# Patient Record
Sex: Male | Born: 1980 | Hispanic: No | Marital: Single | Smoking: Never smoker
Health system: Southern US, Community
[De-identification: ages and names within clinical notes are randomized; demographics above are authoritative.]

## PROBLEM LIST (undated history)

## (undated) DIAGNOSIS — K589 Irritable bowel syndrome without diarrhea: Secondary | ICD-10-CM

## (undated) DIAGNOSIS — G629 Polyneuropathy, unspecified: Secondary | ICD-10-CM

## (undated) DIAGNOSIS — G7101 Duchenne or Becker muscular dystrophy: Secondary | ICD-10-CM

## (undated) HISTORY — DX: Polyneuropathy, unspecified: G62.9

## (undated) HISTORY — PX: OTHER SURGICAL HISTORY: SHX169

## (undated) HISTORY — DX: Irritable bowel syndrome, unspecified: K58.9

## (undated) HISTORY — DX: Duchenne or Becker muscular dystrophy: G71.01

---

## 2000-08-30 HISTORY — PX: OTHER SURGICAL HISTORY: SHX169

## 2003-07-01 HISTORY — PX: OTHER SURGICAL HISTORY: SHX169

## 2004-01-11 ENCOUNTER — Ambulatory Visit: Payer: Self-pay | Admitting: Internal Medicine

## 2004-02-17 ENCOUNTER — Ambulatory Visit: Payer: Self-pay | Admitting: Internal Medicine

## 2004-08-17 ENCOUNTER — Ambulatory Visit: Payer: Self-pay | Admitting: Internal Medicine

## 2005-01-17 ENCOUNTER — Ambulatory Visit: Payer: Self-pay | Admitting: Internal Medicine

## 2005-01-30 HISTORY — PX: OTHER SURGICAL HISTORY: SHX169

## 2005-02-12 ENCOUNTER — Ambulatory Visit: Payer: Self-pay | Admitting: Internal Medicine

## 2005-02-25 ENCOUNTER — Inpatient Hospital Stay (HOSPITAL_COMMUNITY): Admission: EM | Admit: 2005-02-25 | Discharge: 2005-02-28 | Payer: Self-pay | Admitting: Emergency Medicine

## 2005-02-25 ENCOUNTER — Ambulatory Visit: Payer: Self-pay | Admitting: Internal Medicine

## 2005-02-28 ENCOUNTER — Encounter: Payer: Self-pay | Admitting: Internal Medicine

## 2005-03-05 ENCOUNTER — Ambulatory Visit: Payer: Self-pay | Admitting: Internal Medicine

## 2005-08-12 ENCOUNTER — Ambulatory Visit: Payer: Self-pay | Admitting: Internal Medicine

## 2005-08-14 ENCOUNTER — Ambulatory Visit: Payer: Self-pay | Admitting: Internal Medicine

## 2006-01-20 ENCOUNTER — Ambulatory Visit: Payer: Self-pay | Admitting: Internal Medicine

## 2006-07-31 ENCOUNTER — Encounter: Payer: Self-pay | Admitting: Internal Medicine

## 2006-08-27 ENCOUNTER — Telehealth (INDEPENDENT_AMBULATORY_CARE_PROVIDER_SITE_OTHER): Payer: Self-pay | Admitting: *Deleted

## 2006-10-02 ENCOUNTER — Encounter: Payer: Self-pay | Admitting: Internal Medicine

## 2006-10-16 ENCOUNTER — Telehealth: Payer: Self-pay | Admitting: Internal Medicine

## 2006-10-30 ENCOUNTER — Telehealth: Payer: Self-pay | Admitting: Internal Medicine

## 2006-11-07 ENCOUNTER — Encounter: Payer: Self-pay | Admitting: Internal Medicine

## 2006-11-24 ENCOUNTER — Encounter: Payer: Self-pay | Admitting: Internal Medicine

## 2006-12-03 ENCOUNTER — Telehealth (INDEPENDENT_AMBULATORY_CARE_PROVIDER_SITE_OTHER): Payer: Self-pay | Admitting: *Deleted

## 2006-12-05 ENCOUNTER — Telehealth: Payer: Self-pay | Admitting: Internal Medicine

## 2006-12-26 ENCOUNTER — Encounter: Payer: Self-pay | Admitting: Internal Medicine

## 2007-01-01 ENCOUNTER — Encounter: Payer: Self-pay | Admitting: Internal Medicine

## 2007-01-14 ENCOUNTER — Encounter: Payer: Self-pay | Admitting: Internal Medicine

## 2007-01-22 ENCOUNTER — Encounter (INDEPENDENT_AMBULATORY_CARE_PROVIDER_SITE_OTHER): Payer: Self-pay | Admitting: *Deleted

## 2007-01-23 ENCOUNTER — Ambulatory Visit: Payer: Self-pay | Admitting: Internal Medicine

## 2007-01-23 ENCOUNTER — Telehealth: Payer: Self-pay | Admitting: Internal Medicine

## 2007-01-23 DIAGNOSIS — G629 Polyneuropathy, unspecified: Secondary | ICD-10-CM

## 2007-01-26 LAB — CONVERTED CEMR LAB
ALT: 24 units/L (ref 0–53)
AST: 34 units/L (ref 0–37)
Albumin: 4 g/dL (ref 3.5–5.2)
Alkaline Phosphatase: 49 units/L (ref 39–117)
BUN: 4 mg/dL — ABNORMAL LOW (ref 6–23)
Basophils Absolute: 0 10*3/uL (ref 0.0–0.1)
Basophils Relative: 0 % (ref 0.0–1.0)
Bilirubin, Direct: 0.2 mg/dL (ref 0.0–0.3)
CO2: 29 meq/L (ref 19–32)
Calcium: 9.4 mg/dL (ref 8.4–10.5)
Chloride: 105 meq/L (ref 96–112)
Creatinine, Ser: <0.3 mg/dL — ABNORMAL LOW (ref 0.40–1.50)
Eosinophils Absolute: 0.1 10*3/uL (ref 0.0–0.6)
Eosinophils Relative: 1.5 % (ref 0.0–5.0)
Folate: 17.8 ng/mL
Glucose, Bld: 72 mg/dL (ref 70–99)
HCT: 35.5 % — ABNORMAL LOW (ref 39.0–52.0)
Hemoglobin: 12.2 g/dL — ABNORMAL LOW (ref 13.0–17.0)
Lymphocytes Relative: 30.6 % (ref 12.0–46.0)
MCHC: 34.3 g/dL (ref 30.0–36.0)
MCV: 91.6 fL (ref 78.0–100.0)
Monocytes Absolute: 0.3 10*3/uL (ref 0.2–0.7)
Monocytes Relative: 6.2 % (ref 3.0–11.0)
Neutro Abs: 2.9 10*3/uL (ref 1.4–7.7)
Neutrophils Relative %: 61.7 % (ref 43.0–77.0)
Phosphorus: 3.4 mg/dL (ref 2.3–4.6)
Platelets: 97 10*3/uL — ABNORMAL LOW (ref 150–400)
Potassium: 4.2 meq/L (ref 3.5–5.1)
RBC: 3.87 M/uL — ABNORMAL LOW (ref 4.22–5.81)
RDW: 12.1 % (ref 11.5–14.6)
Sodium: 139 meq/L (ref 135–145)
TSH: 1.8 microintl units/mL (ref 0.35–5.50)
Total Bilirubin: 0.9 mg/dL (ref 0.3–1.2)
Total Protein: 6.6 g/dL (ref 6.0–8.3)
Vitamin B-12: 657 pg/mL (ref 211–911)
WBC: 4.7 10*3/uL (ref 4.5–10.5)

## 2007-01-28 DIAGNOSIS — G7101 Duchenne or Becker muscular dystrophy: Secondary | ICD-10-CM

## 2007-02-17 ENCOUNTER — Telehealth (INDEPENDENT_AMBULATORY_CARE_PROVIDER_SITE_OTHER): Payer: Self-pay | Admitting: *Deleted

## 2007-02-19 ENCOUNTER — Ambulatory Visit: Payer: Self-pay | Admitting: Internal Medicine

## 2007-02-25 ENCOUNTER — Encounter: Payer: Self-pay | Admitting: Internal Medicine

## 2007-03-03 ENCOUNTER — Encounter: Payer: Self-pay | Admitting: Internal Medicine

## 2007-03-09 ENCOUNTER — Telehealth (INDEPENDENT_AMBULATORY_CARE_PROVIDER_SITE_OTHER): Payer: Self-pay | Admitting: *Deleted

## 2007-03-23 ENCOUNTER — Encounter: Payer: Self-pay | Admitting: Internal Medicine

## 2007-03-24 ENCOUNTER — Ambulatory Visit: Payer: Self-pay | Admitting: Internal Medicine

## 2007-04-10 ENCOUNTER — Encounter: Payer: Self-pay | Admitting: Internal Medicine

## 2007-04-14 ENCOUNTER — Encounter: Payer: Self-pay | Admitting: Psychiatry

## 2007-04-29 ENCOUNTER — Telehealth (INDEPENDENT_AMBULATORY_CARE_PROVIDER_SITE_OTHER): Payer: Self-pay | Admitting: *Deleted

## 2007-05-03 ENCOUNTER — Encounter: Payer: Self-pay | Admitting: Psychiatry

## 2007-05-04 ENCOUNTER — Encounter: Payer: Self-pay | Admitting: Internal Medicine

## 2007-05-13 ENCOUNTER — Encounter: Payer: Self-pay | Admitting: Internal Medicine

## 2007-05-15 ENCOUNTER — Encounter: Payer: Self-pay | Admitting: Internal Medicine

## 2007-05-31 ENCOUNTER — Encounter: Payer: Self-pay | Admitting: Psychiatry

## 2007-06-03 ENCOUNTER — Encounter: Payer: Self-pay | Admitting: Internal Medicine

## 2007-06-04 ENCOUNTER — Encounter: Payer: Self-pay | Admitting: Internal Medicine

## 2007-06-17 ENCOUNTER — Encounter: Payer: Self-pay | Admitting: Internal Medicine

## 2007-06-25 ENCOUNTER — Encounter: Payer: Self-pay | Admitting: Internal Medicine

## 2007-07-01 ENCOUNTER — Encounter: Payer: Self-pay | Admitting: Psychiatry

## 2007-07-02 ENCOUNTER — Encounter: Payer: Self-pay | Admitting: Internal Medicine

## 2007-07-22 ENCOUNTER — Encounter: Payer: Self-pay | Admitting: Internal Medicine

## 2007-08-31 ENCOUNTER — Encounter: Payer: Self-pay | Admitting: Internal Medicine

## 2007-09-16 ENCOUNTER — Encounter: Payer: Self-pay | Admitting: Internal Medicine

## 2007-09-17 ENCOUNTER — Encounter: Payer: Self-pay | Admitting: Internal Medicine

## 2007-09-21 ENCOUNTER — Ambulatory Visit: Payer: Self-pay | Admitting: Internal Medicine

## 2007-09-21 DIAGNOSIS — G479 Sleep disorder, unspecified: Secondary | ICD-10-CM

## 2007-10-21 ENCOUNTER — Encounter: Payer: Self-pay | Admitting: Internal Medicine

## 2007-10-21 ENCOUNTER — Telehealth: Payer: Self-pay | Admitting: Internal Medicine

## 2007-10-29 ENCOUNTER — Encounter: Payer: Self-pay | Admitting: Internal Medicine

## 2007-11-20 ENCOUNTER — Telehealth: Payer: Self-pay | Admitting: Internal Medicine

## 2007-12-02 ENCOUNTER — Encounter: Payer: Self-pay | Admitting: Internal Medicine

## 2007-12-11 ENCOUNTER — Encounter: Payer: Self-pay | Admitting: Internal Medicine

## 2007-12-11 ENCOUNTER — Telehealth: Payer: Self-pay | Admitting: Internal Medicine

## 2007-12-14 ENCOUNTER — Encounter: Payer: Self-pay | Admitting: Internal Medicine

## 2007-12-17 ENCOUNTER — Encounter: Payer: Self-pay | Admitting: Internal Medicine

## 2008-01-04 ENCOUNTER — Encounter: Payer: Self-pay | Admitting: Internal Medicine

## 2008-01-05 ENCOUNTER — Ambulatory Visit: Payer: Self-pay | Admitting: Family Medicine

## 2008-01-11 ENCOUNTER — Encounter: Payer: Self-pay | Admitting: Internal Medicine

## 2008-02-29 ENCOUNTER — Encounter: Payer: Self-pay | Admitting: Internal Medicine

## 2008-03-04 ENCOUNTER — Encounter: Payer: Self-pay | Admitting: Internal Medicine

## 2008-03-10 ENCOUNTER — Encounter: Payer: Self-pay | Admitting: Internal Medicine

## 2008-04-11 ENCOUNTER — Telehealth: Payer: Self-pay | Admitting: Internal Medicine

## 2008-04-29 ENCOUNTER — Encounter: Payer: Self-pay | Admitting: Internal Medicine

## 2008-05-05 ENCOUNTER — Encounter: Payer: Self-pay | Admitting: Internal Medicine

## 2008-05-18 ENCOUNTER — Encounter: Payer: Self-pay | Admitting: Internal Medicine

## 2008-05-31 ENCOUNTER — Ambulatory Visit: Payer: Self-pay | Admitting: Internal Medicine

## 2008-06-15 ENCOUNTER — Encounter: Payer: Self-pay | Admitting: Internal Medicine

## 2008-06-25 ENCOUNTER — Telehealth (INDEPENDENT_AMBULATORY_CARE_PROVIDER_SITE_OTHER): Payer: Self-pay | Admitting: *Deleted

## 2008-06-27 ENCOUNTER — Telehealth: Payer: Self-pay | Admitting: Internal Medicine

## 2008-06-28 ENCOUNTER — Ambulatory Visit: Payer: Self-pay | Admitting: Internal Medicine

## 2008-06-28 ENCOUNTER — Encounter: Payer: Self-pay | Admitting: Internal Medicine

## 2008-06-29 ENCOUNTER — Encounter: Payer: Self-pay | Admitting: Internal Medicine

## 2008-06-30 ENCOUNTER — Telehealth: Payer: Self-pay | Admitting: Internal Medicine

## 2008-07-06 ENCOUNTER — Encounter: Payer: Self-pay | Admitting: Internal Medicine

## 2008-07-21 ENCOUNTER — Telehealth: Payer: Self-pay | Admitting: Internal Medicine

## 2008-08-15 ENCOUNTER — Encounter: Payer: Self-pay | Admitting: Internal Medicine

## 2008-08-17 ENCOUNTER — Encounter: Payer: Self-pay | Admitting: Internal Medicine

## 2008-08-30 ENCOUNTER — Encounter: Payer: Self-pay | Admitting: Internal Medicine

## 2008-08-31 ENCOUNTER — Encounter: Payer: Self-pay | Admitting: Internal Medicine

## 2008-09-10 ENCOUNTER — Encounter: Payer: Self-pay | Admitting: Internal Medicine

## 2008-09-13 ENCOUNTER — Encounter: Payer: Self-pay | Admitting: Internal Medicine

## 2008-09-26 ENCOUNTER — Encounter: Payer: Self-pay | Admitting: Internal Medicine

## 2008-10-01 ENCOUNTER — Encounter: Payer: Self-pay | Admitting: Internal Medicine

## 2008-10-05 ENCOUNTER — Telehealth: Payer: Self-pay | Admitting: Internal Medicine

## 2008-10-06 ENCOUNTER — Telehealth: Payer: Self-pay | Admitting: Internal Medicine

## 2008-10-10 ENCOUNTER — Encounter: Payer: Self-pay | Admitting: Internal Medicine

## 2008-10-11 ENCOUNTER — Encounter: Payer: Self-pay | Admitting: Internal Medicine

## 2008-10-12 ENCOUNTER — Encounter: Payer: Self-pay | Admitting: Internal Medicine

## 2008-10-18 ENCOUNTER — Telehealth: Payer: Self-pay | Admitting: Internal Medicine

## 2008-10-20 ENCOUNTER — Encounter: Payer: Self-pay | Admitting: Internal Medicine

## 2008-10-25 ENCOUNTER — Encounter: Payer: Self-pay | Admitting: Internal Medicine

## 2008-11-11 ENCOUNTER — Encounter: Payer: Self-pay | Admitting: Internal Medicine

## 2008-12-01 ENCOUNTER — Encounter: Payer: Self-pay | Admitting: Internal Medicine

## 2008-12-09 ENCOUNTER — Encounter: Payer: Self-pay | Admitting: Internal Medicine

## 2008-12-12 ENCOUNTER — Encounter: Payer: Self-pay | Admitting: Internal Medicine

## 2008-12-21 ENCOUNTER — Encounter: Payer: Self-pay | Admitting: Internal Medicine

## 2008-12-26 ENCOUNTER — Ambulatory Visit: Payer: Self-pay | Admitting: Internal Medicine

## 2008-12-28 ENCOUNTER — Ambulatory Visit: Payer: Self-pay | Admitting: Internal Medicine

## 2009-01-04 ENCOUNTER — Encounter: Payer: Self-pay | Admitting: Internal Medicine

## 2009-01-11 ENCOUNTER — Encounter: Payer: Self-pay | Admitting: Internal Medicine

## 2009-01-17 ENCOUNTER — Telehealth: Payer: Self-pay | Admitting: Internal Medicine

## 2009-01-18 ENCOUNTER — Ambulatory Visit: Payer: Self-pay | Admitting: Internal Medicine

## 2009-01-21 ENCOUNTER — Encounter: Payer: Self-pay | Admitting: Internal Medicine

## 2009-01-23 ENCOUNTER — Encounter: Payer: Self-pay | Admitting: Internal Medicine

## 2009-02-01 ENCOUNTER — Encounter: Payer: Self-pay | Admitting: Internal Medicine

## 2009-02-15 ENCOUNTER — Encounter: Payer: Self-pay | Admitting: Internal Medicine

## 2009-03-06 ENCOUNTER — Ambulatory Visit: Payer: Self-pay | Admitting: Internal Medicine

## 2009-03-07 ENCOUNTER — Encounter: Payer: Self-pay | Admitting: Internal Medicine

## 2009-03-15 ENCOUNTER — Encounter: Payer: Self-pay | Admitting: Internal Medicine

## 2009-03-27 ENCOUNTER — Encounter: Payer: Self-pay | Admitting: Internal Medicine

## 2009-04-25 ENCOUNTER — Ambulatory Visit: Payer: Self-pay | Admitting: Internal Medicine

## 2009-05-01 ENCOUNTER — Encounter: Payer: Self-pay | Admitting: Internal Medicine

## 2009-05-11 ENCOUNTER — Encounter: Payer: Self-pay | Admitting: Internal Medicine

## 2009-05-24 ENCOUNTER — Encounter: Payer: Self-pay | Admitting: Internal Medicine

## 2009-06-05 ENCOUNTER — Encounter: Payer: Self-pay | Admitting: Internal Medicine

## 2009-06-06 ENCOUNTER — Encounter: Payer: Self-pay | Admitting: Internal Medicine

## 2009-06-08 ENCOUNTER — Encounter: Payer: Self-pay | Admitting: Internal Medicine

## 2009-06-21 ENCOUNTER — Ambulatory Visit: Payer: Self-pay | Admitting: Internal Medicine

## 2009-06-29 ENCOUNTER — Ambulatory Visit: Payer: Self-pay | Admitting: Internal Medicine

## 2009-07-06 ENCOUNTER — Encounter: Payer: Self-pay | Admitting: Internal Medicine

## 2009-07-23 ENCOUNTER — Encounter: Payer: Self-pay | Admitting: Internal Medicine

## 2009-08-15 ENCOUNTER — Encounter: Payer: Self-pay | Admitting: Internal Medicine

## 2009-08-20 ENCOUNTER — Encounter: Payer: Self-pay | Admitting: Internal Medicine

## 2009-08-21 ENCOUNTER — Ambulatory Visit: Payer: Self-pay | Admitting: Internal Medicine

## 2009-08-25 ENCOUNTER — Encounter: Payer: Self-pay | Admitting: Internal Medicine

## 2009-08-29 ENCOUNTER — Ambulatory Visit: Payer: Self-pay | Admitting: Internal Medicine

## 2009-09-14 ENCOUNTER — Encounter: Payer: Self-pay | Admitting: Internal Medicine

## 2009-09-28 ENCOUNTER — Encounter: Payer: Self-pay | Admitting: Internal Medicine

## 2009-10-05 ENCOUNTER — Encounter: Payer: Self-pay | Admitting: Internal Medicine

## 2009-10-06 ENCOUNTER — Ambulatory Visit: Payer: Self-pay | Admitting: Internal Medicine

## 2009-10-10 ENCOUNTER — Encounter: Payer: Self-pay | Admitting: Internal Medicine

## 2009-11-08 ENCOUNTER — Encounter: Payer: Self-pay | Admitting: Internal Medicine

## 2009-11-23 ENCOUNTER — Encounter: Payer: Self-pay | Admitting: Internal Medicine

## 2009-11-24 ENCOUNTER — Encounter: Payer: Self-pay | Admitting: Internal Medicine

## 2009-11-29 ENCOUNTER — Encounter: Payer: Self-pay | Admitting: Internal Medicine

## 2009-12-08 ENCOUNTER — Ambulatory Visit: Payer: Self-pay | Admitting: Internal Medicine

## 2009-12-21 ENCOUNTER — Encounter: Payer: Self-pay | Admitting: Internal Medicine

## 2010-01-01 ENCOUNTER — Encounter: Payer: Self-pay | Admitting: Internal Medicine

## 2010-01-02 ENCOUNTER — Encounter: Payer: Self-pay | Admitting: Internal Medicine

## 2010-01-16 ENCOUNTER — Ambulatory Visit: Payer: Self-pay | Admitting: Internal Medicine

## 2010-01-22 ENCOUNTER — Encounter: Payer: Self-pay | Admitting: Internal Medicine

## 2010-02-01 ENCOUNTER — Ambulatory Visit: Payer: Self-pay | Admitting: Internal Medicine

## 2010-02-12 ENCOUNTER — Encounter: Payer: Self-pay | Admitting: Internal Medicine

## 2010-02-18 ENCOUNTER — Encounter: Payer: Self-pay | Admitting: Internal Medicine

## 2010-02-21 ENCOUNTER — Encounter: Payer: Self-pay | Admitting: Internal Medicine

## 2010-03-07 ENCOUNTER — Encounter: Payer: Self-pay | Admitting: Internal Medicine

## 2010-03-19 ENCOUNTER — Encounter: Payer: Self-pay | Admitting: Internal Medicine

## 2010-03-29 ENCOUNTER — Encounter: Payer: Self-pay | Admitting: Internal Medicine

## 2010-04-10 ENCOUNTER — Encounter: Payer: Self-pay | Admitting: Internal Medicine

## 2010-04-17 ENCOUNTER — Encounter: Payer: Self-pay | Admitting: Internal Medicine

## 2010-04-30 ENCOUNTER — Encounter: Payer: Self-pay | Admitting: Internal Medicine

## 2010-05-01 NOTE — Miscellaneous (Signed)
Summary: Care Plan/[facility]  Care Plan/[facility]   Imported By: [name] 06/23/2009 11:33:20  _____________________________________________________________________  External Attachment:    Type:   Image     Comment:   External Document

## 2010-05-01 NOTE — Consult Note (Signed)
Summary: [facility]   Imported By: Laural Benes 01/31/2010 13:19:03  _____________________________________________________________________  External Attachment:    Type:   Image     Comment:   External Document  Appended Document: [facility] stable cardiac status Recheck in 1 year

## 2010-05-01 NOTE — Letter (Signed)
Summary: CMN for Parts/[facility]  [facility] for [facility]   Imported By: Edmonia James 12/28/2009 14:19:45  _____________________________________________________________________  External Attachment:    Type:   Image     Comment:   External Document

## 2010-05-01 NOTE — Letter (Signed)
Summary: CMN for Pulse Oximeter/[facility]  [facility] for Pulse Oximeter/[facility]   Imported By: Edmonia James 09/19/2009 13:17:19  _____________________________________________________________________  External Attachment:    Type:   Image     Comment:   External Document

## 2010-05-01 NOTE — Miscellaneous (Signed)
Summary: Addendum to Plan of Care/[facility]  Addendum to Plan of Care/[facility]   Imported By: Laural Benes 10/10/2009 10:14:41  _____________________________________________________________________  External Attachment:    Type:   Image     Comment:   External Document

## 2010-05-01 NOTE — Miscellaneous (Signed)
Summary: Care Plan/[facility]  Care Plan/[facility]   Imported By: Edmonia James 09/05/2009 08:50:06  _____________________________________________________________________  External Attachment:    Type:   Image     Comment:   External Document

## 2010-05-01 NOTE — Miscellaneous (Signed)
Summary: Missed Visit/[facility]  Missed Visit/[facility]   Imported By: Edmonia James 09/05/2009 08:49:01  _____________________________________________________________________  External Attachment:    Type:   Image     Comment:   External Document

## 2010-05-01 NOTE — Miscellaneous (Signed)
Summary: Missed Visit/[facility]  Missed Visit/[facility]   Imported By: Edmonia James 08/30/2009 10:02:24  _____________________________________________________________________  External Attachment:    Type:   Image     Comment:   External Document

## 2010-05-01 NOTE — Letter (Signed)
Summary: CMN for Parts/[facility] [employer]  CMN for Parts/[facility]   Imported By: Edmonia James 01/08/2010 09:54:54  _____________________________________________________________________  External Attachment:    Type:   Image     Comment:   External Document

## 2010-05-01 NOTE — Miscellaneous (Signed)
Summary: Missed Visit Form/[facility]  Missed Visit Form/[facility]   Imported By: Edmonia James 10/20/2009 10:06:14  _____________________________________________________________________  External Attachment:    Type:   Image     Comment:   External Document

## 2010-05-01 NOTE — Miscellaneous (Signed)
Summary: Care Plan/[facility]  Care Plan/[facility]   Imported By: Edmonia James 08/25/2009 10:18:01  _____________________________________________________________________  External Attachment:    Type:   Image     Comment:   External Document

## 2010-05-01 NOTE — Assessment & Plan Note (Signed)
Summary: FLU SHOT/CLE  Nurse Visit   Allergies: 1)  ! * Albuteral 2)  ! Keflex 3)  Penicillin 4)  Lyrica (Pregabalin)  Orders Added: 1)  Flu Vaccine 30yrs + [90658] 2)  Administration Flu vaccine - MCR [J0929]    Flu Vaccine Consent Questions     Do you have a history of severe allergic reactions to this vaccine? no    Any prior history of allergic reactions to egg and/or gelatin? no    Do you have a sensitivity to the preservative Thimersol? no    Do you have a past history of Guillan-Barre Syndrome? no    Do you currently have an acute febrile illness? no    Have you ever had a severe reaction to latex? no    Vaccine information given and explained to patient? yes    Are you currently pregnant? no    Lot Number:[id number]   Exp Date:09/28/2009   Site Given  Left Deltoid IMu   Appended Document: FLU SHOT/CLE lot# for flu shot was [id number]

## 2010-05-01 NOTE — Letter (Signed)
Summary: [facility]  [facility]   Imported By: Edmonia James 12/06/2009 14:09:34  _____________________________________________________________________  External Attachment:    Type:   Image     Comment:   External Document  Appended Document: [facility] satisfied with nucynta and lidoderm for radicular leg pain trying seroquel at night to help sleep

## 2010-05-01 NOTE — Letter (Signed)
Summary: CMN for Oxygen/[facility]  CMN for Oxygen/[facility]   Imported By: Edmonia James 11/30/2009 11:55:05  _____________________________________________________________________  External Attachment:    Type:   Image     Comment:   External Document

## 2010-05-01 NOTE — Miscellaneous (Signed)
Summary: Certification and Plan of Care/[facility]  Certification and Plan of Care/[facility]   Imported By: Laural Benes 07/10/2009 15:04:57  _____________________________________________________________________  External Attachment:    Type:   Image     Comment:   External Document

## 2010-05-01 NOTE — Letter (Signed)
Summary: [facility]  [facility]   Imported By: Edmonia James 04/03/2009 13:38:56  _____________________________________________________________________  External Attachment:    Type:   Image     Comment:   External Document  Appended Document: [facility] still on lidoderm patches trying tramadol for breakthrough

## 2010-05-01 NOTE — Letter (Signed)
Summary: CMN/[facility]  CMN/[facility]   Imported By: Bubba Hales 12/06/2009 12:50:58  _____________________________________________________________________  External Attachment:    Type:   Image     Comment:   External Document

## 2010-05-01 NOTE — Miscellaneous (Signed)
Summary: Missed Shift/Visit Note/[facility]  Missed Shift/Visit Note/[facility]   Imported By: Laural Benes 01/08/2010 15:56:20  _____________________________________________________________________  External Attachment:    Type:   Image     Comment:   External Document

## 2010-05-01 NOTE — Letter (Signed)
Summary: [facility]N & Discharge/[facility]  [facility]charge/[facility]   Imported By: Edmonia James 05/29/2009 11:04:26  _____________________________________________________________________  External Attachment:    Type:   Image     Comment:   External Document

## 2010-05-01 NOTE — Miscellaneous (Signed)
Summary: Care Plan/[facility]  Care Plan/[facility]   Imported By: Edmonia James 10/11/2009 14:23:33  _____________________________________________________________________  External Attachment:    Type:   Image     Comment:   External Document

## 2010-05-01 NOTE — Letter (Signed)
Summary: [facility]  [facility]   Imported By: Edmonia James 05/22/2009 14:11:43  _____________________________________________________________________  External Attachment:    Type:   Image     Comment:   External Document  Appended Document: [facility] stable trying to get new vent for pressure control trial with PVC measurements

## 2010-05-01 NOTE — Miscellaneous (Signed)
Summary: Care Plan/[facility]  Care Plan/[facility]   Imported By: Edmonia James 02/09/2010 14:55:38  _____________________________________________________________________  External Attachment:    Type:   Image     Comment:   External Document

## 2010-05-01 NOTE — Miscellaneous (Signed)
Summary: [facility]  Trach supplies/[facility]   Imported By: Bubba Hales 02/19/2010 14:00:33  _____________________________________________________________________  External Attachment:    Type:   Image     Comment:   External Document

## 2010-05-01 NOTE — Miscellaneous (Signed)
Summary: Missed Visit/[facility]  Missed Visit/[facility]   Imported By: Edmonia James 08/08/2009 11:49:19  _____________________________________________________________________  External Attachment:    Type:   Image     Comment:   External Document

## 2010-05-01 NOTE — Letter (Signed)
Summary: CMN-Oxygen,[facility]  CMN-Oxygen,[facility] [place]   Imported By: Virgia Land 06/07/2009 14:33:47  _____________________________________________________________________  External Attachment:    Type:   Image     Comment:   External Document

## 2010-05-01 NOTE — Miscellaneous (Signed)
Summary: Care Plan/[facility]  Care Plan/[facility]   Imported By: Edmonia James 04/29/2009 10:40:18  _____________________________________________________________________  External Attachment:    Type:   Image     Comment:   External Document

## 2010-05-01 NOTE — Letter (Signed)
Summary: [facility]  [facility]   Imported By: Edmonia James 09/13/2009 12:11:19  _____________________________________________________________________  External Attachment:    Type:   Image     Comment:   External Document  Appended Document: [facility] trying nycnta and flector patch for neuropathic pain

## 2010-05-01 NOTE — Letter (Signed)
Summary: [facility]  [facility]   Imported By: Edmonia James 10/11/2009 09:73:53  _____________________________________________________________________  External Attachment:    Type:   Image     Comment:   External Document  Appended Document: [facility] stopping tramadol ES due to sedation offered nucynta but will try tramadol $RemoveBefo'25mg'[id number]$  as needed instead

## 2010-05-01 NOTE — Letter (Signed)
Summary: [facility]  [facility]   Imported By: Edmonia James 06/15/2009 08:37:33  _____________________________________________________________________  External Attachment:    Type:   Image     Comment:   External Document  Appended Document: [facility] trying erythromycin again as prokinetic agent phenergan as needed for nausea

## 2010-05-01 NOTE — Letter (Signed)
Summary: [facility]  [facility]   Imported By: Edmonia James 10/23/2009 10:48:21  _____________________________________________________________________  External Attachment:    Type:   Image     Comment:   External Document  Appended Document: [facility] neuropathy better on nucynta

## 2010-05-01 NOTE — Miscellaneous (Signed)
Summary: Missed Visit Form/[facility]  Missed Visit Form/[facility]   Imported By: Edmonia James 07/05/2009 11:21:38  _____________________________________________________________________  External Attachment:    Type:   Image     Comment:   External Document

## 2010-05-01 NOTE — Letter (Signed)
Summary: [facility]  [facility]   Imported By: Edmonia James 06/30/2009 08:41:47  _____________________________________________________________________  External Attachment:    Type:   Image     Comment:   External Document

## 2010-05-01 NOTE — Letter (Signed)
Summary: CMNs for Pulse Oximeter Oxygen & Nebulizer/[facility]  CMNs for Pulse Oximeter Oxygen & Nebulizer/[facility]   Imported By: Edmonia James 05/15/2009 10:10:28  _____________________________________________________________________  External Attachment:    Type:   Image     Comment:   External Document

## 2010-05-01 NOTE — Miscellaneous (Signed)
Summary: Missed Visit/[facility]  Missed Visit/[facility]   Imported By: Edmonia James 03/02/2010 13:57:03  _____________________________________________________________________  External Attachment:    Type:   Image     Comment:   External Document

## 2010-05-01 NOTE — Letter (Signed)
Summary: [facility]  [facility]   Imported By: Edmonia James 11/22/2009 12:23:01  _____________________________________________________________________  External Attachment:    Type:   Image     Comment:   External Document

## 2010-05-01 NOTE — Assessment & Plan Note (Signed)
Summary: F/U/CLE   Vital Signs:  Patient profile:   30 year old male Temp:     97.9 degrees F tympanic Pulse rate:   92 / minute Pulse rhythm:   regular BP sitting:   100 / 60  (left arm) Cuff size:   small  Vitals Entered By: Edwin Dada CMA Deborra Medina) (June 29, 2009 2:39 PM) CC: follow-up visit   History of Present Illness: Doing fairly well  Stomach is better  Still not on the erythro as prokinetic agent--unable to get yet through [facility] Now doing better since cutting back on miralax--may have been irritating stomach Eating better Bowels okay  Pulmonary working on new ventiator  Did get it but seemed to have some sort of an allergic reaction to something in that ventilator Had much more secretions---so back to old one Totally vent dependent now--only off for suctioning and during transfers  No voiding problems No catheter   still enjoys working on his computer Downloads movies and music  Neuropathy is better now tramadol helped but was sedating long acting one working better  Allergies: 1)  ! * Albuteral 2)  ! Keflex 3)  Penicillin 4)  Lyrica (Pregabalin)  Past History:  Past medical, surgical, family and social histories (including risk factors) reviewed for relevance to current acute and chronic problems.  Past Medical History: Reviewed history from 09/21/2007 and no changes required. 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 Pneumonia/(resp. failure--- J tube then removed  06/02 Scoilosis repair  (thoracic),  Harrington rods  ~1991 Abd distention - negative Laparotomy 04/05 Pneumonia 11/06  Family History: Reviewed history from 01/28/2007 and no changes required. Parents are healthy 3 brothers/4 sisters--all but 1 were adopted No CAD, HTN, DM No prostate or colon  cancer Muscular dystrophy on Mom's side Hypercholesterolemia on Dad's side  Social History: Reviewed history from 01/28/2007 and no changes required. 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 Occ bad night but generally does okay. Uses restoril as needed   Physical Exam  General:  alert.  NAD Neck:  supple and no masses.  Trach in place--site looks fine Lungs:  normal respiratory effort and normal breath sounds.  No spontaneous respirations  Heart:  normal rate, regular rhythm, no murmur, and no gallop.   Abdomen:  soft and non-tender.   Msk:  contractures throughout extremities Extremities:  no edema Psych:  normally interactive, good eye contact, not anxious appearing, and not depressed appearing.     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 4l per trach 2)  Multivitamins Tabs (Multiple vitamin) .... Take 1 tablet by mouth once a day 3)  Miralax Powd (Polyethylene glycol 3350) .Marland Kitchen.. 1 capful daily 4)  Advil 200 Mg Tabs (Ibuprofen) .... Take 1 tablet by mouth two times a day 5)  Tylenol Extra Strength  500 Mg Tabs (Acetaminophen) .... As needed 6)  Vitamin C 500 Mg Tabs (Ascorbic acid) .... Take 1 by mouth once daily 7)  Tramadol Hcl 100 Mg Xr24h-tab (Tramadol hcl) .Marland Kitchen.. 1 daily in evening for neuropathy  Patient Instructions: 1)  Please schedule a follow-up appointment in 1  year.   Current Allergies (reviewed today): ! * ALBUTERAL ! KEFLEX PENICILLIN LYRICA (PREGABALIN)

## 2010-05-01 NOTE — Miscellaneous (Signed)
Summary: Care Plans/[facility]  Care Plans/[facility]   Imported By: Phillis Knack 12/15/2009 08:56:35  _____________________________________________________________________  External Attachment:    Type:   Image     Comment:   External Document

## 2010-05-03 NOTE — Letter (Signed)
Summary: [facility]   Imported By: Laural Benes 04/03/2010 15:40:48  _____________________________________________________________________  External Attachment:    Type:   Image     Comment:   External Document  Appended Document: [facility] considering trigger point injections Continuing nycynta and trying TENS for neuropathic pain

## 2010-05-03 NOTE — Miscellaneous (Signed)
Summary: Care Plan/[facility]  Care Plan/[facility]   Imported By: Edmonia James 04/16/2010 12:54:38  _____________________________________________________________________  External Attachment:    Type:   Image     Comment:   External Document

## 2010-05-03 NOTE — Letter (Signed)
Summary: Medical Necessity for Pulse Oximeter  Medical Necessity for Pulse Oximeter   Imported By: Laural Benes 03/12/2010 14:21:19  _____________________________________________________________________  External Attachment:    Type:   Image     Comment:   External Document

## 2010-05-03 NOTE — Miscellaneous (Signed)
Summary: Care Plan Addendum/[facility]  Care Plan Addendum/[facility]   Imported By: Edmonia James 03/27/2010 15:09:44  _____________________________________________________________________  External Attachment:    Type:   Image     Comment:   External Document

## 2010-05-03 NOTE — Miscellaneous (Signed)
Summary: Missed Visit/[facility]  Missed Visit/[facility]   Imported By: Edmonia James 04/16/2010 12:55:28  _____________________________________________________________________  External Attachment:    Type:   Image     Comment:   External Document

## 2010-05-09 ENCOUNTER — Telehealth: Payer: Self-pay | Admitting: Internal Medicine

## 2010-05-17 NOTE — Letter (Signed)
Summary: [place]   Imported By: Jamelle Haring 05/07/2010 10:59:35  _____________________________________________________________________  External Attachment:    Type:   Image     Comment:   External Document

## 2010-05-17 NOTE — Progress Notes (Signed)
Summary: needs order for O2 concentrator  Phone Note From Other Clinic   Caller: Curt Bears [phone] with [facility] Summary of Call: Pt requests a portable O2 concentrator, he is using O2 more often during the day now.  [facility] needs the order.  Concentrator that he will be getting gives a continuous dose and has been approved by their respiratory tech.  They need a verbal order from you and will then fax the written order.  Initial call taken by: Marty Heck CMA, [place],  May 09, 2010 3:29 PM  Follow-up for Phone Call        okay to give order  Let his dad know that we should have a visit here sometime in the next couple of months to review how he is doing Follow-up by: Claris Gower MD,  May 10, 2010 1:10 PM  Additional Follow-up for Phone Call Additional follow up Details #1::        spoke with Curt Bears [phone] with [facility] and advised results  Additional Follow-up by: Edwin Dada CMA Deborra Medina),  May 10, 2010 4:15 PM

## 2010-05-22 ENCOUNTER — Encounter: Payer: Self-pay | Admitting: Internal Medicine

## 2010-05-23 NOTE — Miscellaneous (Signed)
Summary: [facility]   [facility]   Imported By: Jamelle Haring 05/15/2010 10:11:36  _____________________________________________________________________  External Attachment:    Type:   Image     Comment:   External Document

## 2010-06-06 ENCOUNTER — Encounter: Payer: Self-pay | Admitting: Family Medicine

## 2010-06-06 DIAGNOSIS — Z930 Tracheostomy status: Secondary | ICD-10-CM

## 2010-06-06 DIAGNOSIS — G7109 Other specified muscular dystrophies: Secondary | ICD-10-CM

## 2010-06-07 NOTE — Miscellaneous (Signed)
Summary: Missed Visit Note/[facility]  Missed Visit Note/[facility]   Imported By: Laural Benes 05/28/2010 10:31:54  _____________________________________________________________________  External Attachment:    Type:   Image     Comment:   External Document

## 2010-06-12 NOTE — Miscellaneous (Signed)
Summary: Certification and Plan of [place]  Certification and Plan of [place] By: Laural Benes 06/07/2010 15:03:47  _____________________________________________________________________  External Attachment:    Type:   Image     Comment:   External Document

## 2010-06-14 ENCOUNTER — Telehealth: Payer: Self-pay | Admitting: Internal Medicine

## 2010-06-19 NOTE — Progress Notes (Signed)
Summary: pt has abd pain  Phone Note Call from Patient Call back at [phone]   Caller: Dad Call For: Greg Gower MD Summary of Call: Pt's dad is calling to report that pt has had constant abd pain for a while.  He had fever last night, but doesnt know if he has any today because he is still asleep.  He is asking if pt can be seen for possible referral to GI.  Asks if you can do a home visit.  Please advise Initial call taken by: Marty Heck CMA, AAMA,  June 14, 2010 10:38 AM  Follow-up for Phone Call        ongoing problems Hard getting in  asked to cut out soda and sugarless gum,etc (if applicable) Plan home visit in a couple of weeks Follow-up by: Greg Gower MD,  June 14, 2010 2:09 PM

## 2010-06-23 ENCOUNTER — Encounter: Payer: Self-pay | Admitting: Internal Medicine

## 2010-06-26 ENCOUNTER — Telehealth: Payer: Self-pay | Admitting: *Deleted

## 2010-06-26 NOTE — Telephone Encounter (Signed)
Patient's dad says that patient has had a bad week. He has been having some chest pain, some difficulty breathing. They found that his ventilator was set higher than it was supposed to be. Dad is concerned about in damage that it could have caused to his lungs. He is asking for a phone call.

## 2010-06-26 NOTE — Telephone Encounter (Signed)
Had vent set at 650 TV instead of 550 for a few days Breathing is better back on the right tidal volume Still with some sternal pain  DIscussed that this is unlikely to cause any ongoing damage to lungs or sternum I would think this should fade away I will evaluate at home visit planned for Friday ~4:30PM

## 2010-06-27 ENCOUNTER — Ambulatory Visit (INDEPENDENT_AMBULATORY_CARE_PROVIDER_SITE_OTHER): Payer: Medicare Other | Admitting: Internal Medicine

## 2010-06-27 ENCOUNTER — Encounter: Payer: Self-pay | Admitting: Internal Medicine

## 2010-06-27 DIAGNOSIS — G7109 Other specified muscular dystrophies: Secondary | ICD-10-CM

## 2010-06-27 DIAGNOSIS — R109 Unspecified abdominal pain: Secondary | ICD-10-CM

## 2010-06-27 DIAGNOSIS — I428 Other cardiomyopathies: Secondary | ICD-10-CM

## 2010-06-27 DIAGNOSIS — R079 Chest pain, unspecified: Secondary | ICD-10-CM | POA: Insufficient documentation

## 2010-06-27 DIAGNOSIS — K3184 Gastroparesis: Secondary | ICD-10-CM | POA: Insufficient documentation

## 2010-06-27 DIAGNOSIS — G479 Sleep disorder, unspecified: Secondary | ICD-10-CM

## 2010-06-27 DIAGNOSIS — G589 Mononeuropathy, unspecified: Secondary | ICD-10-CM

## 2010-06-27 MED ORDER — TRAMADOL HCL 50 MG PO TABS: 25.0000 mg | ORAL_TABLET | Freq: Four times a day (QID) | ORAL | Status: DC | PRN

## 2010-06-27 NOTE — Progress Notes (Signed)
  Subjective:    Patient ID: [name], male    DOB: [birth year], 30 y.o.   MRN: [id number]  HPI Dad is here and so is his nurse Still has 24 hour nursing care  Has episode of ventilator settings being off. Tidal volume at 650cc instead of 550cc He had been uncomfortable and SOB Now at correct TV--breathing feels back to normal Still has deep discomfort in sternum Vent is on IMV at rate of 10---he breathes inbetween but vent is never off Sensation of something heavy on that area Tylenol $RemoveBefo'1000mg'hqwDOSbZnBF$  does help Dull quality  Increased secretions about 9 days ago Felt pop in chest when dad suctioned him Soreness was more noticeable after that  Ongoing abdominal symptoms Worsens through the day --worse by supper Not relieved by BM--which are fairly regular Not in AM --comes on around lunch time Has always had dairy--does okay. Tends to avoid milk though No sugar free candies or gum 2 sugared sodas a day Tends to come on after eating  Past Medical History  Diagnosis Date  . Duchenne muscular dystrophy     with associated cardiomyopathy  . IBS (irritable bowel syndrome)   . Neuropathy     Past Surgical History  Procedure Date  . Pneumonia 6/02    Resp failure--J tube for a while then removed  . Scoliosis repair ~1991    Thoracic--with Harrington rods  . Abdominal distention 4/05    Laparotomy negative  . Pneumonia 11/06    Family History  Problem Relation Age of Onset  . Diabetes Neg Hx   . Heart disease Neg Hx   . Hypertension Neg Hx   . Cancer Neg Hx     History   Social History  . Marital Status: Single    Spouse Name: N/A    Number of Children: 0  . Years of Education: N/A   Occupational History  . Diabled    Social History Main Topics  . Smoking status: Never Smoker   . Smokeless tobacco: Never Used  . Alcohol Use: No  . Drug Use: No  . Sexually Active:    Other Topics Concern  . 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       Power wheelchair bound On vent Alert and cooperative  Neck: No thyromegaly present.  Cardiovascular: Exam reveals no gallop.        Tachycardic Soft systolic murmur not well localized  Pulmonary/Chest: Breath sounds normal. No respiratory distress. He has no wheezes. He has no rales.  Abdominal: Soft. Bowel sounds are normal. He exhibits no distension and no mass. There is no tenderness.  Lymphadenopathy:    He has no cervical adenopathy.  Neurological:       Decreased tone but with contractures in all extremities Active movement in head and very little in right thumb (some control for wheelchair)  Psychiatric: He has a normal mood and affect.          Assessment & Plan:

## 2010-06-29 ENCOUNTER — Ambulatory Visit: Payer: Medicare Other | Admitting: Internal Medicine

## 2010-08-17 NOTE — Discharge Summary (Signed)
[name], [name]              ACCOUNT NO.:  [id number]   MEDICAL RECORD NO.:  [id number]          PATIENT TYPE:  INP   LOCATION:  2315                         FACILITY:  [facility]   PHYSICIAN:  Gwendolyn Grant, M.D. LHCDATE OF BIRTH:  1980-06-06   DATE OF ADMISSION:  02/25/2005  DATE OF DISCHARGE:  02/28/2005                                 DISCHARGE SUMMARY   DISCHARGE DIAGNOSES:  1.  Community-acquired pneumonia.  2.  Duchenne's muscular dystrophy, vent dependent.  3.  Mild hypokalemia.   HISTORY OF PRESENT ILLNESS:  The patient is a 30 year old white male with a  history of Duchenne's muscular dystrophy who is bedfast and ventilator  dependent.  The patient was admitted after having episodes of hypotension  and tachycardia accompanied by periods of hypoxemia.  He was brought to the  emergency department for further evaluation and was noted to have a right-  sided pneumonia.  The patient was admitted for further treatment.   PAST MEDICAL HISTORY:  1.  Duchenne's muscular dystrophy followed at [facility].  2.  Hospitalized 2003 for pneumonia.  3.  In 1996 underwent extensive spinal surgery for stabilization.  4.  History of chronic constipation.   HOSPITAL COURSE:  Problem 1. Community-acquired pneumonia.  The patient was  admitted and was placed on IV antibiotics.  He has remained afebrile with  good saturations throughout this hospitalization.  Plan to continue Avelox  for a total of 10 days.  The patient is vent dependent and family has a  portable ventilator which is used for transporting the patient.   Problem 2. Duchenne's muscular dystrophy.  The patient has a followup  appointment with Dr. Theadore Nan from [facility] for cardiology followup secondary to his  history of muscular dystrophy.  He is to keep that appointment this  afternoon and follow up with Dr. [name]  next week and sooner if he  develops a fever.   Problem 3. Mild hypokalemia.  The patient's potassium  was noted to be 3.0 on  admission.  The patient was given p.o. potassium and this will need to be  rechecked as an outpatient.   DISCHARGE MEDICATIONS:  1.  Avelox 400 mg p.o. daily for six additional days.  2.  Lactulose 30 mL p.o. daily.  3.  Zelnorm 6 mg p.o. daily.  4.  Guaifenesin 600 mg 2-3 times daily.  5.  Tylenol 325 mg as needed.  6.  Ambien 5 mg at bedtime as needed.  7.  Valium 2 mg as needed.   LABORATORY DATA AT DISCHARGE:  Potassium 3.0, sodium 138, BUN 3, creatinine  less than 0.3.   FOLLOW UP:  The patient is to followup with Dr. [name] on Tuesday,  March 05, 2005 at 10:15 a.m.  In addition, he is to followup with  cardiology this afternoon as scheduled.      Melissa S. Inda Castle, NP      Gwendolyn Grant, M.D. [facility]  Electronically Signed    MSO/MEDQ  D:  02/28/2005  T:  02/28/2005  Job:  [id number]

## 2010-08-17 NOTE — H&P (Signed)
[name], [name]              ACCOUNT NO.:  [id number]   MEDICAL RECORD NO.:  [id number]          PATIENT TYPE:  EMS   LOCATION:  MAJO                         FACILITY:  [facility]   PHYSICIAN:  Marletta Lor, M.D. LHCDATE OF BIRTH:  October 01, 1980   DATE OF ADMISSION:  02/25/2005  DATE OF DISCHARGE:                                HISTORY & PHYSICAL   CHIEF COMPLAINT:  Chest pain.   HISTORY OF PRESENT ILLNESS:  The patient is a 30 year old white gentleman  with a history of Duchenne's' muscular dystrophy and has been bedfast and  ventilator dependent.  He was in his usual state of health until  approximately 5 days ago when he was noted to have episodes of hypoxemia.  About that time, he developed some mild symptoms of a URI similar to a  younger brother.  There has been some intermittent cough.  Additionally, he  has had some bilateral chest pain, initially on the right described as sharp  and moderately severe but also occasionally on the left.  The patient has  been on azithromycin for the past 3 days for suspected upper respiratory  tract infection.  Over the weekend, he has had episodes of hypotension,  tachycardia, and, as mentioned, occasional periods of hypoxemia.  The  patient was brought to the emergency department for evaluation today because  of the hypotension and tachycardia.   LABORATORY STUDIES:  White count 5.3.   Chest x-ray reportedly showed a right-sided pneumonia.   UA was unremarkable.  Chemistries likewise normal.   Electrocardiogram was obtained that revealed an RR prong in the right  precordial leads as well as a sinus tachycardia.  The patient is now  admitted for further evaluation and treatment of suspected pneumonia and to  exclude pulmonary thromboembolic disease.   PAST MEDICAL HISTORY:  1.  The patient has a history of Duchenne's muscular dystrophy and has been      followed primarily at [facility].  2.  He was last hospitalized in 2003  for pneumonia.  During this hospital      admission, a tracheostomy was performed, and he has been on a home      ventilator.  During that 2003 hospital admission, he also underwent      exploratory laparotomy for pneumoperitoneum with negative results.  3.  In 1996, he underwent extensive spinal surgery for stabilization.  4.  History also of chronic constipation.   PRESENT MEDICAL REGIMEN:  Includes azithromycin, decussate, Zelnorm,  lactulose, guaifenesin, Tylenol, Ambien, and more recently Valium has been  prescribed for leg cramps.   ALLERGIES:  Include PENICILLIN, ALBUTEROL, and LATEX.   PHYSICAL EXAMINATION:  VITAL SIGNS:  Blood pressure 106/70, pulse rate 110,  respiratory rate normal.  He was afebrile.  GENERAL:  Alert, pleasant, chronically ill male with a tracheostomy.  HEAD AND NECK:  Normal pupillary responses, conjunctivae clear.  ENT  unremarkable.  Mucous membranes appeared well hydrated. Neck revealed no  adenopathy or neck vein distention.  CHEST:  Revealed diminished breath sounds at the bases but essentially clear  anterolaterally.  CARDIOVASCULAR:  Exam  revealed a resting tachycardia, otherwise normal.  ABDOMEN:  Revealed a midline scar.  No organomegaly, mass, tenderness, or  distention.  An adult diaper was in place.  NEUROLOGIC:  Exam revealed multiple contractures.  He was essentially  quadriplegic with only minimal movement of both arms.  He had extreme  diffuse muscle atrophy.  There was no edema.  SKIN:  Revealed no evidence of breakdown.   IMPRESSION:  1.  Possible community-acquired pneumonia, rule out pulmonary thromboembolic      disease.  2.  Muscular dystrophy status post tracheostomy with ventilator dependence.   DISPOSITION:  1.  The patient will be admitted to the hospital.  2.  A CT scan of the chest will be obtained to rule out a pulmonary      embolism.  3.  Pulmonary toilet including suction will be instituted.  4.  With results on CTA,  will place on empiric antibiotic therapy.           ______________________________  Marletta Lor, M.D. [facility]     PFK/MEDQ  D:  02/25/2005  T:  02/25/2005  Job:  [id number]

## 2010-08-17 NOTE — Assessment & Plan Note (Signed)
[facility]                                 ON-CALL NOTE   NAME:BUSSIEREAmram, Maya                       MRN:          [id number]  DATE:07/19/2006                            DOB:          01-24-1981    TELEPHONE NUMBER:  [phone].   SUBJECTIVE:  This is a 30 year old male who has had a headache that  started around 1 o'clock tonight. In addition, he has some nausea and is  beginning to have an urge to have diarrhea. He did have some  intermittent top lip numbness. He has a history of some headaches, but  has never been diagnosed with migraines. His father states that they  have been exposed to a viral gastroenteritis in the family. He has taken  two Tylenol extra strength and a friend of his who is calling is asking  what to do other than this.   ASSESSMENT AND PLAN:  Possible migraine versus viral syndrome. Agreed  with giving two Tylenol extra strength. Recommended giving it more time  to work since it was given within the last half hour. I recommended  hydration and relaxing. If his headache does not improve, he has  Excedrin available which he can use after about four hours of taking the  previous Tylenol. He does note history of passing out and states that  some physicians have recommended a heart evaluation to look into this.  He has not spoken with Dr. Silvio Pate about this. I recommended if symptoms  continue this evening with severe headache to seek treatment at a  emergency room, as well as if he passes out this evening to receive  evaluation at an emergency room as well. Otherwise, he will follow up  with Dr. Silvio Pate on Monday.     Eliezer Lofts, MD  Electronically Signed    AB/MedQ  DD: 07/20/2006  DT: 07/20/2006  Job #: [id number]

## 2010-09-26 ENCOUNTER — Ambulatory Visit: Payer: Medicare Other | Admitting: Internal Medicine

## 2010-09-26 ENCOUNTER — Encounter: Payer: Self-pay | Admitting: Internal Medicine

## 2010-09-26 VITALS — BP 94/54 | HR 122 | Resp 12

## 2010-09-26 DIAGNOSIS — G589 Mononeuropathy, unspecified: Secondary | ICD-10-CM

## 2010-09-26 DIAGNOSIS — G7109 Other specified muscular dystrophies: Secondary | ICD-10-CM

## 2010-09-26 DIAGNOSIS — R109 Unspecified abdominal pain: Secondary | ICD-10-CM

## 2010-09-26 DIAGNOSIS — I428 Other cardiomyopathies: Secondary | ICD-10-CM

## 2010-09-26 NOTE — Patient Instructions (Signed)
Will plan home visit again in about 3 months

## 2010-09-26 NOTE — Assessment & Plan Note (Signed)
Ongoing distention, etc Could be muscular related  Will try to spread out meals more and advised longer trial with lactaid with his cheese  Miralax adjusted for his bowels

## 2010-09-26 NOTE — Assessment & Plan Note (Signed)
Severe disability Discussed having him up in power chair twice a day to facilitate a more normal meal schedule and perhaps have him more tired for bedtime

## 2010-09-26 NOTE — Assessment & Plan Note (Signed)
Doing okay with the nucynta in days and tramadol at night

## 2010-09-26 NOTE — Progress Notes (Signed)
Subjective:    Patient ID: [name], male    DOB: [birth year], 30 y.o.   MRN: [id number]  HPI Home visit Val, the nurse is here Still has 24/7 care  Continues on nucynta bid and tramadol at night for pain Gives him reasonable relief from neuropathic pain  Ongoing abdominal pain Tried decreasing soft drinks Briefly tried lactaid pills--but stopped quickly as he felt it wasn't helping  Only spends 5.5 hours up in power chair due to soreness on bottom Only sore area is from headrest on right ear Currently nurses transfer him without lift since he is only 60#  No chest pain  No SOB Generally is tachycardic-- 90-120 usually No edema  Still on IMV but barely breathes above the vent and very limited spontaneous tidal volume now if vent off  Current Outpatient Prescriptions on File Prior to Visit  Medication Sig Dispense Refill  . acetaminophen (TYLENOL) 500 MG tablet Take 500 mg by mouth every 6 (six) hours as needed.        . Ascorbic Acid (VITAMIN C) 500 MG tablet Take 500 mg by mouth daily.        Marland Kitchen ibuprofen (ADVIL,MOTRIN) 200 MG tablet Take 200 mg by mouth every 6 (six) hours as needed.       . Multiple Vitamins-Minerals (MULTIVITAMIN WITH MINERALS) tablet Take 1 tablet by mouth daily.        . polyethylene glycol (MIRALAX / GLYCOLAX) packet Take 17 g by mouth daily.        Marland Kitchen DISCONTD: traMADol (ULTRAM) 50 MG tablet Take 0.5-2 tablets (25-100 mg total) by mouth every 6 (six) hours as needed for pain.  60 tablet  0    Allergies  Allergen Reactions  . Cephalexin     REACTION: GI distress  . Penicillins     REACTION: rash only  . Pregabalin     REACTION: urinary retention    Past Medical History  Diagnosis Date  . Duchenne muscular dystrophy     with associated cardiomyopathy  . IBS (irritable bowel syndrome)   . Neuropathy     Past Surgical History  Procedure Date  . Pneumonia 6/02    Resp failure--J tube for a while then removed  . Scoliosis repair ~1991      Thoracic--with Harrington rods  . Abdominal distention 4/05    Laparotomy negative  . Pneumonia 11/06    Family History  Problem Relation Age of Onset  . Diabetes Neg Hx   . Heart disease Neg Hx   . Hypertension Neg Hx   . Cancer Neg Hx     History   Social History  . Marital Status: Single    Spouse Name: N/A    Number of Children: 0  . Years of Education: N/A   Occupational History  . Diabled    Social History Main Topics  . Smoking status: Never Smoker   . Smokeless tobacco: Never Used  . Alcohol Use: No  . Drug Use: No  . Sexually Active:    Other Topics Concern  . 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 Have to moderate dose because it can cause cramps and gas Occ urinary retention if he gets too gassy Tends to stay up late--then doesn't sleep that great    Objective:   Physical Exam  Constitutional:  Severe muscle wasting throughout Alert and appropriate interaction  Neck: No thyromegaly present.  Cardiovascular: Regular rhythm and normal heart sounds.  Exam reveals no gallop.   No murmur heard.      tachycardic  Pulmonary/Chest: Effort normal and breath sounds normal. No respiratory distress. He has no wheezes. He has no rales.  Abdominal: Soft. There is no tenderness.  Musculoskeletal:       Contractures in hands, feet, knees, elbows and hips  Lymphadenopathy:    He has no cervical adenopathy.  Neurological:       Hypotonia along with the contractures  Psychiatric: He has a normal mood and affect. His behavior is normal. Judgment and thought content normal.          Assessment & Plan:

## 2010-09-26 NOTE — Assessment & Plan Note (Signed)
Chronic tachycardia No CHF

## 2010-10-04 DIAGNOSIS — Z930 Tracheostomy status: Secondary | ICD-10-CM

## 2010-10-04 DIAGNOSIS — G7109 Other specified muscular dystrophies: Secondary | ICD-10-CM

## 2010-12-05 DIAGNOSIS — Z930 Tracheostomy status: Secondary | ICD-10-CM

## 2010-12-05 DIAGNOSIS — G7109 Other specified muscular dystrophies: Secondary | ICD-10-CM

## 2010-12-17 ENCOUNTER — Telehealth: Payer: Self-pay | Admitting: Internal Medicine

## 2010-12-17 NOTE — Telephone Encounter (Signed)
noted 

## 2010-12-17 NOTE — Telephone Encounter (Signed)
Mr. [name] returned your call, confirmed appointment for Wednesday, Sept 19, 2012 at 3:15pm....cdavis 0--17-2012

## 2010-12-19 ENCOUNTER — Ambulatory Visit: Payer: Medicare Other | Admitting: Internal Medicine

## 2010-12-19 ENCOUNTER — Encounter: Payer: Self-pay | Admitting: Internal Medicine

## 2010-12-19 DIAGNOSIS — G589 Mononeuropathy, unspecified: Secondary | ICD-10-CM

## 2010-12-19 DIAGNOSIS — R109 Unspecified abdominal pain: Secondary | ICD-10-CM

## 2010-12-19 DIAGNOSIS — G7109 Other specified muscular dystrophies: Secondary | ICD-10-CM

## 2010-12-19 DIAGNOSIS — I428 Other cardiomyopathies: Secondary | ICD-10-CM

## 2010-12-19 NOTE — Progress Notes (Signed)
Subjective:    Patient ID: [name], male    DOB: May 07, 1980, 30 y.o.   MRN: [id number]  HPI Has nurse here as usual Continues on 24 hour care  Stopped the nucynta due to sedation in day and depression Still takes the tramadol at bedtime This helps control the neuropathy pain  Still has stomach trouble Bad spell with incomplete bowel emptying recently---better in past couple of days No change in cramping with lactaid  Still up in chair for 3.5-4 hours Didn't want to change to twice a day regimen Still gets sore on bottom and this provokes return to bed No skin redness or ulcers  Stays on oxygen 1./min regularly for comfort sats stay in high 90's Still on IMV 10 but no longer breathes above the vent  Current Outpatient Prescriptions on File Prior to Visit  Medication Sig Dispense Refill  . acetaminophen (TYLENOL) 500 MG tablet Take 500 mg by mouth every 6 (six) hours as needed.        . Ascorbic Acid (VITAMIN C) 500 MG tablet Take 500 mg by mouth daily.        Marland Kitchen ibuprofen (ADVIL,MOTRIN) 200 MG tablet Take 200 mg by mouth every 6 (six) hours as needed.       . Multiple Vitamins-Minerals (MULTIVITAMIN WITH MINERALS) tablet Take 1 tablet by mouth daily.        . polyethylene glycol (MIRALAX / GLYCOLAX) packet Take 17 g by mouth daily.        . traMADol (ULTRAM) 50 MG tablet Take 25-100 mg by mouth at bedtime.          Allergies  Allergen Reactions  . Cephalexin     REACTION: GI distress  . Penicillins     REACTION: rash only  . Pregabalin     REACTION: urinary retention    Past Medical History  Diagnosis Date  . Duchenne muscular dystrophy     with associated cardiomyopathy  . IBS (irritable bowel syndrome)   . Neuropathy     Past Surgical History  Procedure Date  . Pneumonia 6/02    Resp failure--J tube for a while then removed  . Scoliosis repair ~1991    Thoracic--with Harrington rods  . Abdominal distention 4/05    Laparotomy negative  . Pneumonia  11/06    Family History  Problem Relation Age of Onset  . Diabetes Neg Hx   . Heart disease Neg Hx   . Hypertension Neg Hx   . Cancer Neg Hx     History   Social History  . Marital Status: Single    Spouse Name: N/A    Number of Children: 0  . Years of Education: N/A   Occupational History  . Diabled    Social History Main Topics  . Smoking status: Never Smoker   . Smokeless tobacco: Never Used  . Alcohol Use: No  . Drug Use: No  . Sexually Active:    Other Topics Concern  . 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 Mom handles shipping when purchases made    Objective:   Physical Exam  Constitutional:       Wheelchair bound No movement except head Vent dependent Very little muscle mass anywhere  Neck: No thyromegaly present.  Cardiovascular: Normal  rate and regular rhythm.  Exam reveals no gallop.   No murmur heard.      Distant sounds  Pulmonary/Chest: Breath sounds normal. No respiratory distress. He has no wheezes. He has no rales.       Totally vent dependent now  Abdominal: Soft. There is no tenderness.  Musculoskeletal: He exhibits tenderness.       No synovitis Contractures in elbows, hands and legs but not severe  Lymphadenopathy:    He has no cervical adenopathy.  Neurological:       Decreased tone No strength in extremities at all  Psychiatric: He has a normal mood and affect. His behavior is normal. Judgment and thought content normal.          Assessment & Plan:

## 2010-12-19 NOTE — Patient Instructions (Signed)
Will plan follow up in about 3 months

## 2010-12-19 NOTE — Assessment & Plan Note (Signed)
Ongoing Most likely due to myopathy in GI tract Again discussed small meals May need stimulant like enema or dulcolax supp intermittently for bowels

## 2010-12-19 NOTE — Assessment & Plan Note (Signed)
Severe Totally vent dependent 24 hour nursing Supportive family

## 2010-12-19 NOTE — Assessment & Plan Note (Signed)
No heart failure Chronic hypotension and tachycardia

## 2010-12-19 NOTE — Assessment & Plan Note (Signed)
Reasonable pain control at night Couldn't tolerate the nucynta Okay to add low doses of tramadol in day if pain worsens

## 2011-02-04 DIAGNOSIS — Z930 Tracheostomy status: Secondary | ICD-10-CM

## 2011-02-04 DIAGNOSIS — G7109 Other specified muscular dystrophies: Secondary | ICD-10-CM

## 2011-03-27 ENCOUNTER — Ambulatory Visit: Payer: Medicare Other | Admitting: Internal Medicine

## 2011-03-27 ENCOUNTER — Encounter: Payer: Self-pay | Admitting: Internal Medicine

## 2011-03-27 VITALS — BP 84/60 | HR 108 | Resp 10

## 2011-03-27 DIAGNOSIS — I428 Other cardiomyopathies: Secondary | ICD-10-CM

## 2011-03-27 DIAGNOSIS — G7109 Other specified muscular dystrophies: Secondary | ICD-10-CM

## 2011-03-27 DIAGNOSIS — G479 Sleep disorder, unspecified: Secondary | ICD-10-CM

## 2011-03-27 DIAGNOSIS — G589 Mononeuropathy, unspecified: Secondary | ICD-10-CM

## 2011-03-27 DIAGNOSIS — R109 Unspecified abdominal pain: Secondary | ICD-10-CM

## 2011-03-27 MED ORDER — METOCLOPRAMIDE HCL 5 MG PO TABS: 5.0000 mg | ORAL_TABLET | Freq: Every evening | ORAL | Status: AC | PRN

## 2011-03-27 NOTE — Assessment & Plan Note (Signed)
Recurrent every day for many hours Generally in evening after eating Seems to be a type of gastroparesis with bloating, etc Will try reglan after discussing possible side effects If not helpful, can try hydrocodone in evenings also

## 2011-03-27 NOTE — Assessment & Plan Note (Signed)
Mostly due to the abdominal discomfort

## 2011-03-27 NOTE — Assessment & Plan Note (Signed)
Satisfied with the tramadol at bedtime

## 2011-03-27 NOTE — Progress Notes (Signed)
Subjective:    Patient ID: [name], male    DOB: [birth year], 30 y.o.   MRN: [id number]  HPI Nurse here  No new concerns Still with stomach issues Eats small frequent meals---while up in wheelchair Has trouble swallowing when in bed so avoids it then He is still not excited about being up twice in the day Transfers are manually as he is only about 60#  No sores on bottom Still notices mild discomfort over dependent areas when in chair for 5 hours or so No skin breakdown though  Neuropathy is controlled Uses tramadol nightly and adds another if pain is not controlled Had bad bout of abdominal pain a couple of nights ago---nurse wonders about stronger med just in case   Bowels have generally been going fine with the miralax This does not relieve cramping and bloating though  Current Outpatient Prescriptions on File Prior to Visit  Medication Sig Dispense Refill  . acetaminophen (TYLENOL) 500 MG tablet Take 500 mg by mouth every 6 (six) hours as needed.        . Ascorbic Acid (VITAMIN C) 500 MG tablet Take 500 mg by mouth daily.        Marland Kitchen ibuprofen (ADVIL,MOTRIN) 200 MG tablet Take 200 mg by mouth every 6 (six) hours as needed.       . Multiple Vitamins-Minerals (MULTIVITAMIN WITH MINERALS) tablet Take 1 tablet by mouth daily.        . polyethylene glycol (MIRALAX / GLYCOLAX) packet Take 17 g by mouth daily.        . traMADol (ULTRAM) 50 MG tablet Take 25-100 mg by mouth at bedtime.          Allergies  Allergen Reactions  . Cephalexin     REACTION: GI distress  . Penicillins     REACTION: rash only  . Pregabalin     REACTION: urinary retention    Past Medical History  Diagnosis Date  . Duchenne muscular dystrophy     with associated cardiomyopathy  . IBS (irritable bowel syndrome)   . Neuropathy     Past Surgical History  Procedure Date  . Pneumonia 6/02    Resp failure--J tube for a while then removed  . Scoliosis repair ~1991    Thoracic--with Harrington  rods  . Abdominal distention 4/05    Laparotomy negative  . Pneumonia 11/06    Family History  Problem Relation Age of Onset  . Diabetes Neg Hx   . Heart disease Neg Hx   . Hypertension Neg Hx   . Cancer Neg Hx     History   Social History  . Marital Status: Single    Spouse Name: N/A    Number of Children: 0  . Years of Education: N/A   Occupational History  . Diabled    Social History Main Topics  . Smoking status: Never Smoker   . Smokeless tobacco: Never Used  . Alcohol Use: No  . Drug Use: No  . Sexually Active:    Other Topics Concern  . 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       Alert in usual state In wheelchair  Neck: No thyromegaly present.  Cardiovascular: Regular rhythm  and normal heart sounds.  Exam reveals no gallop.        Tachycardic ?slight systolic murmur  Pulmonary/Chest: Effort normal and breath sounds normal. No respiratory distress. He has no wheezes. He has no rales.       Doesn't breathe above the ventilator  Abdominal: Soft. There is no tenderness.  Lymphadenopathy:    He has no cervical adenopathy.  Neurological:       No active movement except face Decreased tone in LE, increased in UE  Psychiatric: He has a normal mood and affect. His behavior is normal. Judgment and thought content normal.          Assessment & Plan:

## 2011-03-27 NOTE — Assessment & Plan Note (Signed)
Requires total care still Has 24 hour nursing care Needs to be fed, etc Has had effect on cardiac and GI smooth muscle as well

## 2011-03-27 NOTE — Assessment & Plan Note (Signed)
Stable status baseline tachycardia persists

## 2011-04-11 ENCOUNTER — Telehealth: Payer: Self-pay

## 2011-04-11 NOTE — Telephone Encounter (Signed)
[facility] with [facility] needs letter (on letterhead not prescription) of medical necessity for 2nd ventilator. Marzetta Board said letter needs to state patient requires portable ventilator attached to wheelchair for portability. Letter can be faxed to [phone] Stacy's attention or if need to speak with Stacy [phone].

## 2011-04-12 ENCOUNTER — Encounter: Payer: Self-pay | Admitting: Internal Medicine

## 2011-04-12 NOTE — Telephone Encounter (Signed)
Letter written  Please send to them

## 2011-04-12 NOTE — Telephone Encounter (Signed)
I faxed note to [facility].

## 2011-04-15 DIAGNOSIS — G7109 Other specified muscular dystrophies: Secondary | ICD-10-CM | POA: Diagnosis not present

## 2011-04-15 DIAGNOSIS — J9610 Chronic respiratory failure, unspecified whether with hypoxia or hypercapnia: Secondary | ICD-10-CM | POA: Diagnosis not present

## 2011-04-17 DIAGNOSIS — Z930 Tracheostomy status: Secondary | ICD-10-CM

## 2011-04-17 DIAGNOSIS — G7109 Other specified muscular dystrophies: Secondary | ICD-10-CM | POA: Diagnosis not present

## 2011-05-06 ENCOUNTER — Telehealth: Payer: Self-pay | Admitting: *Deleted

## 2011-05-06 NOTE — Telephone Encounter (Signed)
Patients father calling says patient is having a lot of trouble sleeping and is requesting a sleep aid. Patient father would like to speak to the Doctor at doctor convience

## 2011-05-07 MED ORDER — TRAZODONE HCL 50 MG PO TABS: 25.0000 mg | ORAL_TABLET | Freq: Every evening | ORAL | Status: DC | PRN

## 2011-05-07 NOTE — Telephone Encounter (Signed)
Discussed with dad Will try trazodone ---start with 1/2 tab but can take as much as 2 Plans to just use it intermittently

## 2011-08-06 ENCOUNTER — Ambulatory Visit: Payer: Medicare Other | Admitting: Internal Medicine

## 2011-08-06 ENCOUNTER — Encounter: Payer: Self-pay | Admitting: Internal Medicine

## 2011-08-06 VITALS — BP 98/56 | HR 110 | Resp 12

## 2011-08-06 DIAGNOSIS — G478 Other sleep disorders: Secondary | ICD-10-CM | POA: Diagnosis not present

## 2011-08-06 DIAGNOSIS — G7109 Other specified muscular dystrophies: Secondary | ICD-10-CM

## 2011-08-06 DIAGNOSIS — G589 Mononeuropathy, unspecified: Secondary | ICD-10-CM | POA: Diagnosis not present

## 2011-08-06 DIAGNOSIS — G479 Sleep disorder, unspecified: Secondary | ICD-10-CM

## 2011-08-06 DIAGNOSIS — I428 Other cardiomyopathies: Secondary | ICD-10-CM | POA: Diagnosis not present

## 2011-08-06 NOTE — Assessment & Plan Note (Signed)
Chronic Didn't like trazodone Doesn't think melatonin helped Gets hang over effect with some  Discussed trying $RemoveBeforeDE'75mg'lVqEhRgdsDNByoi$  of tramadol with tylenol also

## 2011-08-06 NOTE — Assessment & Plan Note (Signed)
Chronic tachycardia No Rx possible

## 2011-08-06 NOTE — Assessment & Plan Note (Signed)
Tramadol helps at night Doesn't take it otherwise

## 2011-08-06 NOTE — Assessment & Plan Note (Addendum)
Totally disabled 24 hour nursing care Bowels fair with miralax daily---at varying doses

## 2011-08-06 NOTE — Progress Notes (Signed)
Subjective:    Patient ID: [name], male    DOB: [birth year], 31 y.o.   MRN: [id number]  HPI Nurse and dad here for visit Moved to guest house--now has his own place He likes this  Has ongoing bowel issues  Takes miralax daily---adjusts dose depending on how he is doing  Up in chair for 4-5 hours per day Gets up at noon and has breakfast, lunch about 2PM and then eats something before going back to bed Transfer by lift only Trying to stay up in chair more No skin breakdown Doesn't want 2 separate times in chair since it takes a while to settle in with each change  Pain is generally controlled Just uses the tramadol at night Rarely uses a second one ---but this gets him tired in the morning  Needs to be fed Total care Can't even move his head Doesn't like ensure or other supplements--or milk shakes  Not been on computer lately Has tried Dragon---difficult to train to it. May go back to this Can use joystick some on wheelchair  Current Outpatient Prescriptions on File Prior to Visit  Medication Sig Dispense Refill  . acetaminophen (TYLENOL) 500 MG tablet Take 500 mg by mouth every 6 (six) hours as needed.        . Ascorbic Acid (VITAMIN C) 500 MG tablet Take 500 mg by mouth daily.        . Cholecalciferol (VITAMIN D) 2000 UNITS CAPS Take 1 capsule by mouth daily.        Marland Kitchen ibuprofen (ADVIL,MOTRIN) 200 MG tablet Take 200 mg by mouth every 6 (six) hours as needed.       . Multiple Vitamins-Minerals (MULTIVITAMIN WITH MINERALS) tablet Take 1 tablet by mouth daily.        . polyethylene glycol (MIRALAX / GLYCOLAX) packet Take 17 g by mouth daily.          Allergies  Allergen Reactions  . Cephalexin     REACTION: GI distress  . Penicillins     REACTION: rash only  . Pregabalin     REACTION: urinary retention    Past Medical History  Diagnosis Date  . Duchenne muscular dystrophy     with associated cardiomyopathy  . IBS (irritable bowel syndrome)   . Neuropathy       Past Surgical History  Procedure Date  . Pneumonia 6/02    Resp failure--J tube for a while then removed  . Scoliosis repair ~1991    Thoracic--with Harrington rods  . Abdominal distention 4/05    Laparotomy negative  . Pneumonia 11/06    Family History  Problem Relation Age of Onset  . Diabetes Neg Hx   . Heart disease Neg Hx   . Hypertension Neg Hx   . Cancer Neg Hx     History   Social History  . Marital Status: Single    Spouse Name: N/A    Number of Children: 0  . Years of Education: N/A   Occupational History  . Diabled    Social History Main Topics  . Smoking status: Never Smoker   . Smokeless tobacco: Never Used  . Alcohol Use: No  . Drug Use: No  . Sexually Active:    Other Topics Concern  . 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       alert  Neck: No thyromegaly present.  Cardiovascular: Normal heart sounds.  Exam reveals no gallop.   No murmur heard.      Tachycardic Hyperdynamic sounds  Pulmonary/Chest: Effort normal and breath sounds normal. No respiratory distress. He has no wheezes. He has no rales.       Doesn't breathe above the vent  Abdominal: Soft. There is no tenderness.  Musculoskeletal:       Extensor contractions of feet Hypotonic extremities till RO< more full---then tight  Lymphadenopathy:    He has no cervical adenopathy.  Psychiatric: He has a normal mood and affect. Thought content normal.          Assessment & Plan:

## 2011-09-16 ENCOUNTER — Telehealth: Payer: Self-pay | Admitting: *Deleted

## 2011-09-16 NOTE — Telephone Encounter (Signed)
Kim at [facility] calling to see if there had been a room air O2 saturation done? At this point portable O2 tanks aren't approved. He currently doesn't qualify for daytime use, only night time. They have been providing portable tanks for 1 year with no documentation and need something to support it for billing.

## 2011-09-16 NOTE — Telephone Encounter (Signed)
I can do this at my next visit This is the first I have heard of this

## 2011-09-17 NOTE — Telephone Encounter (Signed)
[facility] with [facility] and left message for [name] with results, advised we would check o2 at next visit.

## 2011-09-27 DIAGNOSIS — G7109 Other specified muscular dystrophies: Secondary | ICD-10-CM | POA: Diagnosis not present

## 2011-09-27 DIAGNOSIS — Z930 Tracheostomy status: Secondary | ICD-10-CM

## 2011-11-01 ENCOUNTER — Telehealth: Payer: Self-pay | Admitting: Internal Medicine

## 2011-11-01 MED ORDER — AZITHROMYCIN 200 MG/5ML PO SUSR: 400.0000 mg | Freq: Every day | ORAL | Status: DC

## 2011-11-01 NOTE — Addendum Note (Signed)
Addended by: [name] I on: 11/01/2011 05:20 PM   Modules accepted: Orders

## 2011-11-01 NOTE — Telephone Encounter (Signed)
LMOVM of call back number.

## 2011-11-01 NOTE — Telephone Encounter (Signed)
°  aller: [name]; PCP: [name]; CB#: [phone]; ; Call regarding Vent Pt With Increased Secretions and Fever;.  Inceased secretions started 10/29/11 and last temp was 98.4 and he usually runs 96.0  Pt dDoes not seem to be getting deep breaths and they are  having to suction deeper.  Dr. Silvio Pate sees pt with home visits.  Traiged Fever Adult with his Nurse Stanton Kidney and disp is call the provider immediately as pt is immunocompromised. All emergent synptoms ruled out and disp = call provider immediately.   They are asking for an antibiotic and worried about pneumonia.  Called Resource Nurse and inst to send note to ofc.  Pt requesting a med in liquid form if at all possible.  They use [facility] in [place] at [phone].

## 2011-11-01 NOTE — Telephone Encounter (Signed)
Please let them know I sent Rx for antibiotic

## 2011-11-05 ENCOUNTER — Telehealth: Payer: Self-pay | Admitting: Internal Medicine

## 2011-11-05 NOTE — Telephone Encounter (Signed)
Caller: [name]; PCP: [name]; CB#: [phone]; ; 65 pounds; Call regarding Secretions Increased; Sats Ok;  states having slightly increased work of breathing.  Finished 5 days of azithromycin 11/06/11.  Afebrile.  Home care RN tells him she hears mild wheezing on left side.  Dad wants extension of antibiotic course if possible, because he does not seem to be improving.  States he looks in distress, although his saturations are 98% on 1.5-2 L/min of O2.  HR 100 baseline, now 118.  Temp 98.4 O.  TC to Dr. Leanne Chang; since had dose of antibiotic late 11/05/11, will be covered until can follow up with office in AM.   Info to office for provider review/callback. MAY REACH DAD AT [phone].

## 2011-11-06 NOTE — Telephone Encounter (Signed)
Dee, please call to check on pt. Zpack should still be in his system for several more days.

## 2011-11-07 NOTE — Telephone Encounter (Signed)
Doing better today, fever broke, secretions still there, dad thinks he's on the road to recovery and will call if more abx are needed.

## 2011-11-18 DIAGNOSIS — J90 Pleural effusion, not elsewhere classified: Secondary | ICD-10-CM | POA: Diagnosis not present

## 2011-11-18 DIAGNOSIS — G7000 Myasthenia gravis without (acute) exacerbation: Secondary | ICD-10-CM | POA: Diagnosis not present

## 2011-11-18 DIAGNOSIS — R918 Other nonspecific abnormal finding of lung field: Secondary | ICD-10-CM | POA: Diagnosis not present

## 2011-11-18 DIAGNOSIS — G7109 Other specified muscular dystrophies: Secondary | ICD-10-CM | POA: Diagnosis not present

## 2011-11-18 DIAGNOSIS — G478 Other sleep disorders: Secondary | ICD-10-CM | POA: Diagnosis not present

## 2011-11-19 ENCOUNTER — Other Ambulatory Visit: Payer: Self-pay | Admitting: Internal Medicine

## 2011-11-19 NOTE — Telephone Encounter (Signed)
Patient's father calling to let you know that pt was seen at [facility] neurology and because of the pain and SOB, the neurologist requested an x-ray, per pt's father, the x-ray showed his left lung cloudy, dad 1st stated his lung had collasped, but then said it may he may have aspirated pneumonia? Per dad pt has no energy, he's very weak, has shallow breathing, has SOB, no fever. Dad just wanted to let Dr.Letvak be aware of what's going on, they are now requesting appts with pulmonary and GI at [facility], dad requested Dr.Letvak give him a call.

## 2011-11-20 NOTE — Telephone Encounter (Signed)
rx sent to pharmacy by e-script  

## 2011-11-20 NOTE — Telephone Encounter (Signed)
Okay #[phone] x [phone]

## 2011-11-21 ENCOUNTER — Encounter: Payer: Self-pay | Admitting: Internal Medicine

## 2011-11-21 ENCOUNTER — Ambulatory Visit: Payer: Medicare Other | Admitting: Internal Medicine

## 2011-11-21 VITALS — BP 96/58 | HR 84 | Resp 10

## 2011-11-21 DIAGNOSIS — G479 Sleep disorder, unspecified: Secondary | ICD-10-CM | POA: Diagnosis not present

## 2011-11-21 DIAGNOSIS — G7109 Other specified muscular dystrophies: Secondary | ICD-10-CM

## 2011-11-21 DIAGNOSIS — G589 Mononeuropathy, unspecified: Secondary | ICD-10-CM

## 2011-11-21 DIAGNOSIS — R0609 Other forms of dyspnea: Secondary | ICD-10-CM

## 2011-11-21 DIAGNOSIS — R0989 Other specified symptoms and signs involving the circulatory and respiratory systems: Secondary | ICD-10-CM | POA: Diagnosis not present

## 2011-11-21 DIAGNOSIS — Z9911 Dependence on respirator [ventilator] status: Secondary | ICD-10-CM | POA: Insufficient documentation

## 2011-11-21 DIAGNOSIS — R0689 Other abnormalities of breathing: Secondary | ICD-10-CM

## 2011-11-21 MED ORDER — ACETYLCYSTEINE 10 % IN SOLN: 4.0000 mL | RESPIRATORY_TRACT | Status: DC | PRN

## 2011-11-21 MED ORDER — ACETYLCYSTEINE 20 % IN SOLN: 2.0000 mL | RESPIRATORY_TRACT | Status: DC | PRN

## 2011-11-21 NOTE — Addendum Note (Signed)
Addended by: Despina Hidden on: 11/21/2011 03:39 PM   Modules accepted: Orders

## 2011-11-21 NOTE — Assessment & Plan Note (Signed)
Still gets some relief from the tramadol

## 2011-11-21 NOTE — Assessment & Plan Note (Signed)
Total care Bed/chair bound No neuro changes

## 2011-11-21 NOTE — Assessment & Plan Note (Signed)
More anxiety with resp difficulty Has used the alprazolam more

## 2011-11-21 NOTE — Assessment & Plan Note (Addendum)
CXR reportedly showed sig atelectasis on left I haven't gotten the report ?effusion ?infiltrate  Does have trouble swallowing but not sure LLL would be most likely place for aspiration to show up Not sick and doesn't clinically have pneumonia Likely has mucous plugging and V/Q mismatch  First step is to increase rate----clearly not enough effective ventilation with rate of 10 Put up to 14 and he seemed to have some immediate improvement Will try mucomyst via nebulizer---order sent Can try this and percussion therapy followed by suctioning Could consider increased TV if he appears to have clearing of plugs with lower inspiratory pressure but may just be better to keep his rate up Vent cannot give sighs unfortunately  Will send note to his pulmonologist at [facility] for any other ideas

## 2011-11-21 NOTE — Progress Notes (Signed)
Subjective:    Patient ID: [name], male    DOB: 1980/09/19, 31 y.o.   MRN: [id number]  HPI Dad and nurse are here  Had fever about 3 weeks ago---mostly low grade (100.4) Rx empirically with azithromycin Fever did resolve but it took awhile  Scheduled with routine neuro follow up Some concerns about airway and breathing Tried to change appt to ENT---?not filling left lung correctly ?dragging some secretions Neurologist did see him but got CXR at that time No CXR since 2005---this one was quite different Left lung opacified to a large degree---and some left lower lobe collapse  ??some effusion as well  He notes some additional dyspnea Still volume ventilated at 550cc Peak pressures are now up from 18-22 up to 30 Doesn't get sense of clearing secretions---like he "is breathing through phlegm"  Oxygen increased from 1 to 1.5 l in trach circuit due to being uncomfortable sats in the high 90's persistently though  Has been using cough assist device This has increased secretions being suctioned but no improvement in breathing Did try percussion therapy----brief help but no relief  No fever Unable to generate a cough Doesn't really feel sick  Appetite is off May have lost some more weight over this time  Sleep is not so good Has needed alprazolam at night---waking at night with some panicky feelings  Still gets OOB once a day---nurses just pick him up Current Outpatient Prescriptions on File Prior to Visit  Medication Sig Dispense Refill  . acetaminophen (TYLENOL) 500 MG tablet Take 500 mg by mouth every 6 (six) hours as needed.        . Ascorbic Acid (VITAMIN C) 500 MG tablet Take 500 mg by mouth daily.        . Cholecalciferol (VITAMIN D) 2000 UNITS CAPS Take 1 capsule by mouth daily.        Marland Kitchen ibuprofen (ADVIL,MOTRIN) 200 MG tablet Take 200 mg by mouth every 6 (six) hours as needed.       . Multiple Vitamins-Minerals (MULTIVITAMIN WITH MINERALS) tablet Take 1 tablet  by mouth daily.        . polyethylene glycol (MIRALAX / GLYCOLAX) packet Take 17 g by mouth daily.        . traMADol (ULTRAM) 50 MG tablet TAKE 1/2 TO 1 TABLET BY MOUTH EVERY 4 TO 6 HOURS **NOT TO EXCEED 8 TABLETS IN A 24 HOUR PERIOD**  240 tablet  0    Allergies  Allergen Reactions  . Cephalexin     REACTION: GI distress  . Penicillins     REACTION: rash only  . Pregabalin     REACTION: urinary retention    Past Medical History  Diagnosis Date  . Duchenne muscular dystrophy     with associated cardiomyopathy  . IBS (irritable bowel syndrome)   . Neuropathy     Past Surgical History  Procedure Date  . Pneumonia 6/02    Resp failure--J tube for a while then removed  . Scoliosis repair ~1991    Thoracic--with Harrington rods  . Abdominal distention 4/05    Laparotomy negative  . Pneumonia 11/06    Family History  Problem Relation Age of Onset  . Diabetes Neg Hx   . Heart disease Neg Hx   . Hypertension Neg Hx   . Cancer Neg Hx     History   Social History  . Marital Status: Single    Spouse Name: N/A    Number of Children: 0  .  Years of Education: N/A   Occupational History  . Diabled    Social History Main Topics  . Smoking status: Never Smoker   . Smokeless tobacco: Never Used  . Alcohol Use: No  . Drug Use: No  . Sexually Active:    Other Topics Concern  . 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       Alert in bed  Neck: No thyromegaly present.  Cardiovascular: Normal rate, regular rhythm and normal heart sounds.  Exam reveals no gallop.   No murmur heard. Pulmonary/Chest: He has no wheezes. He has no rales.       Feels uncomfortable Tubular sounds at left base but seems to have good air flow on both sides by auscultation  Abdominal: Soft.  There is no tenderness.  Lymphadenopathy:    He has no cervical adenopathy.  Neurological:       Contractures in hands/legs Increased tone  Psychiatric: He has a normal mood and affect. His behavior is normal.          Assessment & Plan:

## 2011-11-22 ENCOUNTER — Telehealth: Payer: Self-pay | Admitting: *Deleted

## 2011-11-22 NOTE — Telephone Encounter (Signed)
Dad calling stating that pt is doing so much better and that he thanks Dr.Letvak for coming out yesterday, per dad pt has appt with [facility] pulmonary 11/25/2011 @ 3:30pm, he will also sign a release to have records faxed to [facility]

## 2011-11-23 NOTE — Telephone Encounter (Signed)
That is great to hear!!!

## 2011-11-25 DIAGNOSIS — G7109 Other specified muscular dystrophies: Secondary | ICD-10-CM | POA: Diagnosis not present

## 2011-11-25 DIAGNOSIS — J984 Other disorders of lung: Secondary | ICD-10-CM | POA: Diagnosis not present

## 2011-11-25 DIAGNOSIS — Z79899 Other long term (current) drug therapy: Secondary | ICD-10-CM | POA: Diagnosis not present

## 2011-11-25 DIAGNOSIS — J9600 Acute respiratory failure, unspecified whether with hypoxia or hypercapnia: Secondary | ICD-10-CM | POA: Diagnosis not present

## 2011-11-26 ENCOUNTER — Telehealth: Payer: Self-pay | Admitting: *Deleted

## 2011-11-26 NOTE — Telephone Encounter (Signed)
Pt's father calling stating that they seen the pulmonologist at [facility] yesterday and due to the lateness the lab closed before they could get labs drawn, they wrote rx for pt to have labs drawn at [place] in [place]. [facility] went to [facility] and they couldn't find a vein to draw from, dad wanted them to draw from pt's foot but per [facility] that would be a liability and they wouldn't do it, dad wanted to bring pt here to have labs drawn and I advised Aniceto Boss or Terri wasn't here and no one else would attempt to do that. Dad states he didn't want to drive back to [place] just for labs, I advised I would send Dr.Letvak a note to get his thoughts and that he wasn't in this afternoon so it would be tomorrow.

## 2011-11-27 NOTE — Telephone Encounter (Signed)
Okay And good to hear about his improvement

## 2011-11-27 NOTE — Telephone Encounter (Signed)
Spoke with dad and he didn't get labs done because [name]'s Lucianne Lei broke down, the brakes locked up, so dad made an appt at [place] next Thursday, he will also request a copy of the labs be sent to [facility]. Dad also states pt's secretions are better and pt is feeling better.

## 2011-11-27 NOTE — Telephone Encounter (Signed)
I am not sure that Tasha or Karna Christmas would draw from a foot either.  What tests did he order?

## 2011-11-28 DIAGNOSIS — G7109 Other specified muscular dystrophies: Secondary | ICD-10-CM

## 2011-11-28 DIAGNOSIS — Z930 Tracheostomy status: Secondary | ICD-10-CM | POA: Diagnosis not present

## 2011-12-05 DIAGNOSIS — J9610 Chronic respiratory failure, unspecified whether with hypoxia or hypercapnia: Secondary | ICD-10-CM | POA: Diagnosis not present

## 2011-12-05 DIAGNOSIS — Z79899 Other long term (current) drug therapy: Secondary | ICD-10-CM | POA: Diagnosis not present

## 2011-12-05 DIAGNOSIS — G1221 Amyotrophic lateral sclerosis: Secondary | ICD-10-CM | POA: Diagnosis not present

## 2012-01-09 DIAGNOSIS — I318 Other specified diseases of pericardium: Secondary | ICD-10-CM | POA: Diagnosis not present

## 2012-01-09 DIAGNOSIS — G7109 Other specified muscular dystrophies: Secondary | ICD-10-CM | POA: Diagnosis not present

## 2012-01-14 ENCOUNTER — Telehealth: Payer: Self-pay

## 2012-01-14 NOTE — Telephone Encounter (Signed)
Lynn left v/m Dr Silvio Pate ordered nebulizer in Aug and diagnosis code not qualified diagnosis code for medicare.Please advise.

## 2012-01-15 NOTE — Telephone Encounter (Signed)
Not sure what would work  Ventilatory failure Atelectasis with mucus plugging  Those are the reasons Do they have any other ideas

## 2012-01-16 NOTE — Telephone Encounter (Signed)
Okay to put down 406--other chronic airway obstruction This is as close to what he has going on that there is

## 2012-01-16 NOTE — Telephone Encounter (Signed)
[facility]oke with [facility]aud[facility] at [facility] and she faxed over a list of diag codes, they are on your desk to choose from.

## 2012-01-17 NOTE — Telephone Encounter (Signed)
Left diag code with receptionist, lynn was unavailable. Advised to call back if that didn't work.

## 2012-01-21 DIAGNOSIS — G7109 Other specified muscular dystrophies: Secondary | ICD-10-CM

## 2012-01-21 DIAGNOSIS — Z930 Tracheostomy status: Secondary | ICD-10-CM | POA: Diagnosis not present

## 2012-02-12 ENCOUNTER — Encounter: Payer: Self-pay | Admitting: Internal Medicine

## 2012-02-12 ENCOUNTER — Ambulatory Visit: Payer: Medicare Other | Admitting: Internal Medicine

## 2012-02-12 VITALS — BP 90/60 | HR 108 | Temp 96.8°F | Resp 14

## 2012-02-12 DIAGNOSIS — G589 Mononeuropathy, unspecified: Secondary | ICD-10-CM | POA: Diagnosis not present

## 2012-02-12 DIAGNOSIS — R0609 Other forms of dyspnea: Secondary | ICD-10-CM

## 2012-02-12 DIAGNOSIS — I429 Cardiomyopathy, unspecified: Secondary | ICD-10-CM

## 2012-02-12 DIAGNOSIS — G7109 Other specified muscular dystrophies: Secondary | ICD-10-CM

## 2012-02-12 DIAGNOSIS — Z23 Encounter for immunization: Secondary | ICD-10-CM

## 2012-02-12 DIAGNOSIS — R0689 Other abnormalities of breathing: Secondary | ICD-10-CM

## 2012-02-12 NOTE — Progress Notes (Signed)
Subjective:    Patient ID: [name], male    DOB: September 30, 1980, 31 y.o.   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 Not able to even do any breathing on his own Has one for when he is in bed Needs mobile vent which is on his motorized wheelchair Will go out for medical appointments and shopping Has to be in wheelchair to spend time in other house with family, access his special computer set up  Also now oxygen dependent all the time Formerly only needed it at night Now needs it 24 hours per day If off the oxygen in day--- he gets tachycardic and some SOB Nurses haven't been able to document his desaturations but with his cardiomyopathy, even the tachycardia indicates inadequate oxygenation Order give for continuous oxygen with concentrator---and could really benefit from portable concentrator  Eating about the same Eats 2 meals per day while up in wheelchair 1 large snack while in bed Seems to have gained some weight  Using the tramadol every night---seems to help pain Sometimes needs second dose Now using xanax from pulmonologist---has helped anxiety that he gets in evening  Did have injury to left shoulder recently [place] pop when putting shirt on Still hurts with ROM but slowly improving  Current Outpatient Prescriptions on File Prior to Visit  Medication Sig Dispense Refill  . acetaminophen (TYLENOL) 500 MG tablet Take 500 mg by mouth every 6 (six) hours as needed.        . Ascorbic Acid (VITAMIN C) 500 MG tablet Take 500 mg by mouth daily.        . Cholecalciferol (VITAMIN D) 2000 UNITS CAPS Take 1 capsule  by mouth daily.        Marland Kitchen ibuprofen (ADVIL,MOTRIN) 200 MG tablet Take 200 mg by mouth every 6 (six) hours as needed.       . Multiple Vitamins-Minerals (MULTIVITAMIN WITH MINERALS) tablet Take 1 tablet by mouth daily.        . polyethylene glycol (MIRALAX / GLYCOLAX) packet Take 17 g by mouth daily.        . traMADol (ULTRAM) 50 MG tablet TAKE 1/2 TO 1 TABLET BY MOUTH EVERY 4 TO 6 HOURS **NOT TO EXCEED 8 TABLETS IN A 24 HOUR PERIOD**  240 tablet  0    Allergies  Allergen Reactions  . Cephalexin     REACTION: GI distress  . Penicillins     REACTION: rash only  . Pregabalin     REACTION: urinary retention    Past Medical History  Diagnosis Date  . Duchenne muscular dystrophy     with associated cardiomyopathy  . IBS (irritable bowel syndrome)   . Neuropathy     Past Surgical History  Procedure Date  . Pneumonia 6/02    Resp failure--J tube for a while then removed  . Scoliosis repair ~1991    Thoracic--with Harrington rods  . Abdominal distention 4/05    Laparotomy negative  . Pneumonia 11/06    Family History  Problem Relation Age of Onset  . Diabetes Neg Hx   . Heart disease Neg Hx   .  Hypertension Neg Hx   . Cancer Neg Hx     History   Social History  . Marital Status: Single    Spouse Name: N/A    Number of Children: 0  . Years of Education: N/A   Occupational History  . Diabled    Social History Main Topics  . Smoking status: Never Smoker   . Smokeless tobacco: Never Used  . Alcohol Use: No  . Drug Use: No  . Sexually Active:    Other Topics Concern  . 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 Can be severe. ?related to chronic abdominal pain Tried ibuprofen--no help. Hasn't tried tramadol (since the pain only occurs when up in chair)     Objective:    Physical Exam  Constitutional: No distress.       Comfortable in motorized wheelchair  Neck: No thyromegaly present.  Cardiovascular: Normal rate and normal heart sounds.  Exam reveals no gallop.   No murmur heard.      Tachycardic No gallop  Pulmonary/Chest: Effort normal and breath sounds normal. No respiratory distress. He has no wheezes. He has no rales.  Abdominal: Soft. There is no tenderness.  Musculoskeletal:       Pain with abduction or rotation of left shoulder (has improved)  Lymphadenopathy:    He has no cervical adenopathy.  Neurological:       Decreased tone Almost no volitional movement          Assessment & Plan:

## 2012-02-12 NOTE — Addendum Note (Signed)
Addended by: Despina Hidden on: 02/12/2012 04:09 PM   Modules accepted: Orders

## 2012-02-12 NOTE — Assessment & Plan Note (Signed)
Does okay with the tramadol at night Not clear if pain in penis is related to this---?mechanical. Will look into how he is in wheelchair and consider getting him back in bed if pain recurs

## 2012-02-12 NOTE — Assessment & Plan Note (Signed)
Much better  Has responded to increased respiratory rate Discussed possibility of cuffed trach---he is not interested since he feels better

## 2012-02-12 NOTE — Assessment & Plan Note (Signed)
Disabling and stable No Rx

## 2012-02-12 NOTE — Assessment & Plan Note (Signed)
Intermittent tachycardia still Needs the oxygen all the time now

## 2012-02-19 DIAGNOSIS — G7109 Other specified muscular dystrophies: Secondary | ICD-10-CM | POA: Diagnosis not present

## 2012-02-19 DIAGNOSIS — K5902 Outlet dysfunction constipation: Secondary | ICD-10-CM | POA: Diagnosis not present

## 2012-02-19 DIAGNOSIS — R141 Gas pain: Secondary | ICD-10-CM | POA: Diagnosis not present

## 2012-02-19 DIAGNOSIS — K5900 Constipation, unspecified: Secondary | ICD-10-CM | POA: Diagnosis not present

## 2012-02-24 ENCOUNTER — Encounter: Payer: Self-pay | Admitting: Internal Medicine

## 2012-02-24 ENCOUNTER — Ambulatory Visit (INDEPENDENT_AMBULATORY_CARE_PROVIDER_SITE_OTHER): Payer: Medicare Other | Admitting: Internal Medicine

## 2012-02-24 VITALS — Temp 98.1°F

## 2012-02-24 DIAGNOSIS — R3915 Urgency of urination: Secondary | ICD-10-CM | POA: Insufficient documentation

## 2012-02-24 DIAGNOSIS — R300 Dysuria: Secondary | ICD-10-CM

## 2012-02-24 LAB — POCT URINALYSIS DIPSTICK
Bilirubin, UA: NEGATIVE
Blood, UA: NEGATIVE
Glucose, UA: NEGATIVE
Leukocytes, UA: NEGATIVE
Nitrite, UA: NEGATIVE
Protein, UA: NEGATIVE
Spec Grav, UA: 1.015
Urobilinogen, UA: NEGATIVE
pH, UA: 7.5

## 2012-02-24 MED ORDER — CIPROFLOXACIN HCL 250 MG PO TABS: 250.0000 mg | ORAL_TABLET | Freq: Two times a day (BID) | ORAL | Status: DC

## 2012-02-24 NOTE — Assessment & Plan Note (Signed)
New symptom for him No sig dullness to suggest urinary retention Gave order to RN to straight cath prn to assess for residual or if he seems to have discomfort (retention)  Will treat with cipro in case this could be prostate related

## 2012-02-24 NOTE — Progress Notes (Signed)
  Subjective:    Patient ID: [name], male    DOB: 1980/06/15, 31 y.o.   MRN: [id number]  HPI Here with his RN  Has had urinary urgency for the past week or so Has been going more often also No dysuria No hematuria No fever No abdominal pain Current Outpatient Prescriptions on File Prior to Visit  Medication Sig Dispense Refill  . acetaminophen (TYLENOL) 500 MG tablet Take 500 mg by mouth every 6 (six) hours as needed.        . ALPRAZolam (XANAX) 0.25 MG tablet Take 0.25 mg by mouth at bedtime.      . Ascorbic Acid (VITAMIN C) 500 MG tablet Take 500 mg by mouth daily.        . Cholecalciferol (VITAMIN D) 2000 UNITS CAPS Take 1 capsule by mouth daily.        Marland Kitchen ibuprofen (ADVIL,MOTRIN) 200 MG tablet Take 200 mg by mouth every 6 (six) hours as needed.       . Multiple Vitamins-Minerals (MULTIVITAMIN WITH MINERALS) tablet Take 1 tablet by mouth daily.        . polyethylene glycol (MIRALAX / GLYCOLAX) packet Take 17 g by mouth daily.        . traMADol (ULTRAM) 50 MG tablet TAKE 1/2 TO 1 TABLET BY MOUTH EVERY 4 TO 6 HOURS **NOT TO EXCEED 8 TABLETS IN A 24 HOUR PERIOD**  240 tablet  0    Allergies  Allergen Reactions  . Cephalexin     REACTION: GI distress  . Penicillins     REACTION: rash only  . Pregabalin     REACTION: urinary retention    Past Medical History  Diagnosis Date  . Duchenne muscular dystrophy     with associated cardiomyopathy  . IBS (irritable bowel syndrome)   . Neuropathy     Past Surgical History  Procedure Date  . Pneumonia 6/02    Resp failure--J tube for a while then removed  . Scoliosis repair ~1991    Thoracic--with Harrington rods  . Abdominal distention 4/05    Laparotomy negative  . Pneumonia 11/06    Family History  Problem Relation Age of Onset  . Diabetes Neg Hx   . Heart disease Neg Hx   . Hypertension Neg Hx   . Cancer Neg Hx     History   Social History  . Marital Status: Single    Spouse Name: N/A    Number of  Children: 0  . Years of Education: N/A   Occupational History  . Diabled    Social History Main Topics  . Smoking status: Never Smoker   . Smokeless tobacco: Never Used  . Alcohol Use: No  . Drug Use: No  . Sexually Active:    Other Topics Concern  . 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  Abdominal: Soft. There is no tenderness.  Psychiatric: He has a normal mood and affect. His behavior is normal.          Assessment & Plan:

## 2012-02-25 ENCOUNTER — Telehealth: Payer: Self-pay | Admitting: Internal Medicine

## 2012-02-25 NOTE — Telephone Encounter (Signed)
[facility] Triage Call Report Triage Record Num: [id number] Operator: Sheran Spine Patient Name: Shelia Magallon Call Date & Time: 02/24/2012 6:23:31PM Patient Phone: [phone] PCP: [name] Patient Gender: Male PCP Fax : [phone] Patient DOB: 1981-01-20 Practice Name[facility] Reason for Call: Caller: [name]; PCP: [name] (Family Practice); CB#: [phone]; Father states pt was seen in the office today for UTI and prescribed Cipro. Medication was sent in pill form and pt can only take liquids. RN verified RX in EPIC and per standing orders RN called pharmacy ([facility] in [place] - [phone]) to change form from tab to liq. RN spoke with Loralyn Freshwater Protocol(s) Used: Office Note Recommended Outcome per Protocol: Information Noted and Sent to Office Reason for Outcome: Caller information to office Care Advice: ~ 02/24/2012 6:33:34PM Page 1 of 1 [email or web address]

## 2012-02-27 NOTE — Telephone Encounter (Signed)
Thanks for making the change

## 2012-03-02 ENCOUNTER — Telehealth: Payer: Self-pay

## 2012-03-02 NOTE — Telephone Encounter (Signed)
Triage Record Num: [id number] Operator: Berniece Salines Patient Name: [name] Call Date & Time: 02/29/2012 8:08:17PM Patient Phone: [phone] PCP: [name] Patient Gender: Male PCP Fax : [phone] Patient DOB: [birth year] Practice Name[facility] Reason for Call: Caller: [name]; PCP: [name] (Family Practice); CB#: [phone]; Call regarding Catheter problems; calling to thank Kyung Rudd RN who triaged pt 02/29/12 1641. Protocol(s) Used: Office Note Recommended Outcome per Protocol: Information Noted and Sent to Office Reason for Outcome: Caller information to office Care Advice: ~

## 2012-03-02 NOTE — Telephone Encounter (Signed)
Triage Record Num: [id number] Operator: Simonne Maffucci Patient Name: [name] Call Date & Time: 02/29/2012 4:41:44PM Patient Phone: [phone] PCP: [name] Patient Gender: Male PCP Fax : [phone] Patient DOB: Aug 21, 1980 Practice Name[facility] Reason for Call: Caller: [name]; PCP: [name]; CB#: [phone]; Call regarding Problems using catheter;Dr Silvio Pate ordered insertion of catheter 11/25, Had enema Instructed caller on use of catheter and strategies for passing the catheter. Follow up call scheduled to check on progress. Home care nurse was unable to pass catheter after 2 attempts, Urinating only small amounts at a time and is feeling urge to go, Advised Call provider Immediately per Urinary Symptoms - Male Protocol due to " Current or recent urinary tract instrumentation and urinary tract symptoms or no urine flow." Spoke with Dr [name] on call provider and ordered to send patient to ED to have catheter placed there. Father verbalized understanding and will take son to [facility] ED per his preference. Protocol(s) Used: Urinary Symptoms - Male Recommended Outcome per Protocol: Call Provider Immediately Override Outcome if Used in Protocol: See ED Immediately RN Reason for Override Outcome: Physician Orders. Reason for Outcome: Current or recent urinary tract instrumentation (e.g., temporary catheter) AND urinary tract symptoms OR no urine flow Care Advice: ~ 11/30/

## 2012-03-02 NOTE — Telephone Encounter (Signed)
Please check to see how he is doing today

## 2012-03-03 NOTE — Telephone Encounter (Signed)
.  left message to have patient's father return my call.

## 2012-03-03 NOTE — Telephone Encounter (Signed)
No need for a diuretic It is common for constipation to cause bladder problems so it sounds like he will void fine as long as we keep him clear in his rectum. So I agree, no need for urologist at this point  I would agree with an enema every 3 days

## 2012-03-03 NOTE — Telephone Encounter (Signed)
Spoke with father and pt's bladder was full and they couldn't cath patient and they ended up giving pt a enema because he was blocked and then he voided 750cc at one time, per dad he will watch this, he is asking if a diuretic would help patient?  Per dad he will keep pt on a regiment of enemas to help with voiding. Is this ok per dad? He doesn't think pt needs a urologist right now and dad states his GI dr suggested pt do a enema every 3 days.

## 2012-03-05 NOTE — Telephone Encounter (Signed)
Spoke with dad and advised results, he will call if any problems

## 2012-03-09 DIAGNOSIS — A5211 Tabes dorsalis: Secondary | ICD-10-CM | POA: Diagnosis not present

## 2012-03-09 DIAGNOSIS — J9610 Chronic respiratory failure, unspecified whether with hypoxia or hypercapnia: Secondary | ICD-10-CM | POA: Diagnosis not present

## 2012-03-09 DIAGNOSIS — J984 Other disorders of lung: Secondary | ICD-10-CM | POA: Diagnosis not present

## 2012-03-16 ENCOUNTER — Telehealth: Payer: Self-pay

## 2012-03-16 NOTE — Telephone Encounter (Signed)
pts father said pt stopped Xanax about 2 weeks ago, still having problems with pt feeling full with bladder discomfort; pt does not feel like he is emptying bladder even after urinates. Pt taking enemas q 3-4 days but not relieving urinary symptoms. Have to manually push on bladder to get urination started. Request referral to urologist.Please advise.

## 2012-03-16 NOTE — Telephone Encounter (Signed)
Let him know that this is probably from the muscular dystrophy and the likely action will be leaving a catheter in there permanently  I can still make referral if he wants me to

## 2012-03-17 NOTE — Telephone Encounter (Signed)
Left message with results, asking father to return my call if he really want the referral.

## 2012-03-26 DIAGNOSIS — Z930 Tracheostomy status: Secondary | ICD-10-CM | POA: Diagnosis not present

## 2012-03-26 DIAGNOSIS — G7109 Other specified muscular dystrophies: Secondary | ICD-10-CM | POA: Diagnosis not present

## 2012-03-27 ENCOUNTER — Telehealth: Payer: Self-pay | Admitting: Internal Medicine

## 2012-03-27 DIAGNOSIS — R339 Retention of urine, unspecified: Secondary | ICD-10-CM

## 2012-03-27 NOTE — Telephone Encounter (Signed)
Spoke to Alison Stalling father. He wants a referral to [facility] because he cant urinate. The only way he can go is by pressing on his bladder to push it out. Noone has been able to Cath him either so therefore they want to see a specialist at [facility] where he sees most of his Drs. Call Johnat [phone] with referral information

## 2012-03-27 NOTE — Telephone Encounter (Signed)
[facility] Dept and the New Pt Coordinator is out today and I will call them on Monday.

## 2012-04-06 DIAGNOSIS — R339 Retention of urine, unspecified: Secondary | ICD-10-CM | POA: Diagnosis not present

## 2012-04-06 DIAGNOSIS — N319 Neuromuscular dysfunction of bladder, unspecified: Secondary | ICD-10-CM | POA: Diagnosis not present

## 2012-04-27 ENCOUNTER — Other Ambulatory Visit: Payer: Self-pay | Admitting: Internal Medicine

## 2012-04-27 DIAGNOSIS — G7109 Other specified muscular dystrophies: Secondary | ICD-10-CM | POA: Diagnosis not present

## 2012-04-27 DIAGNOSIS — N3289 Other specified disorders of bladder: Secondary | ICD-10-CM | POA: Diagnosis not present

## 2012-04-27 DIAGNOSIS — N1330 Unspecified hydronephrosis: Secondary | ICD-10-CM | POA: Diagnosis not present

## 2012-04-27 DIAGNOSIS — G608 Other hereditary and idiopathic neuropathies: Secondary | ICD-10-CM | POA: Diagnosis not present

## 2012-04-27 DIAGNOSIS — J9610 Chronic respiratory failure, unspecified whether with hypoxia or hypercapnia: Secondary | ICD-10-CM | POA: Diagnosis not present

## 2012-05-13 ENCOUNTER — Ambulatory Visit: Payer: Medicare Other | Admitting: Internal Medicine

## 2012-05-20 ENCOUNTER — Ambulatory Visit: Payer: Medicare Other | Admitting: Internal Medicine

## 2012-05-20 ENCOUNTER — Encounter: Payer: Self-pay | Admitting: Internal Medicine

## 2012-05-20 VITALS — BP 88/50 | HR 90 | Resp 14

## 2012-05-20 DIAGNOSIS — G7109 Other specified muscular dystrophies: Secondary | ICD-10-CM | POA: Diagnosis not present

## 2012-05-20 DIAGNOSIS — G479 Sleep disorder, unspecified: Secondary | ICD-10-CM

## 2012-05-20 DIAGNOSIS — R0609 Other forms of dyspnea: Secondary | ICD-10-CM

## 2012-05-20 DIAGNOSIS — R339 Retention of urine, unspecified: Secondary | ICD-10-CM

## 2012-05-20 DIAGNOSIS — G589 Mononeuropathy, unspecified: Secondary | ICD-10-CM

## 2012-05-20 MED ORDER — TRAZODONE HCL 50 MG PO TABS: 50.0000 mg | ORAL_TABLET | Freq: Every day | ORAL | Status: DC

## 2012-05-20 NOTE — Assessment & Plan Note (Signed)
Comfortable with current vent setttings

## 2012-05-20 NOTE — Assessment & Plan Note (Signed)
Doesn't initiate till 3-4AM This really bothers him Melatonin no help  Will try the trazodone again Consider the alprazolam also---though he associates this with his bladder worsening (though this is unlikely)

## 2012-05-20 NOTE — Assessment & Plan Note (Signed)
Satisfied with the tramadol

## 2012-05-20 NOTE — Progress Notes (Signed)
Subjective:    Patient ID: [name], male    DOB: 1981-03-11, 32 y.o.   MRN: [id number]  HPI Nurse is here Dad briefly stopped in  Doing okay Recent neurology follow up  Still some bladder problems No recent caths needed Nurse will cuvee bladder and he is managing to empty okay  Continues on the miralax for bowels This keeps him regular  Breathing is still fine No dyspnea on the rate of 14 now No changes made by pulmonary  Eating okay Weight is fairly stable  Still concerned about sleep problems Can't initiate often till 3-4AM May sleep until 1PM Had uses trazodone in past but was inconsistent with it--may have helped though Melatonin wasn't helpful  Neuropathy is under control Satisfied with the tramadol  Current Outpatient Prescriptions on File Prior to Visit  Medication Sig Dispense Refill  . acetaminophen (TYLENOL) 500 MG tablet Take 500 mg by mouth every 6 (six) hours as needed.        . ALPRAZolam (XANAX) 0.25 MG tablet Take 0.25 mg by mouth at bedtime.      . Ascorbic Acid (VITAMIN C) 500 MG tablet Take 500 mg by mouth daily.        . Cholecalciferol (VITAMIN D) 2000 UNITS CAPS Take 1 capsule by mouth daily.        . ciprofloxacin (CIPRO) 250 MG tablet Take 1 tablet (250 mg total) by mouth 2 (two) times daily.  20 tablet  0  . ibuprofen (ADVIL,MOTRIN) 200 MG tablet Take 200 mg by mouth every 6 (six) hours as needed.       . Multiple Vitamins-Minerals (MULTIVITAMIN WITH MINERALS) tablet Take 1 tablet by mouth daily.        . polyethylene glycol (MIRALAX / GLYCOLAX) packet Take 17 g by mouth daily.        . Probiotic Product (PROBIOTIC DAILY) CAPS Take by mouth daily.      . traMADol (ULTRAM) 50 MG tablet TAKE 1/2 TO 1 TABLET BY MOUTH EVERY 4 TO 6 HOURS **NOT TO EXCEED 8 TABLETS IN A 24 HOUR PERIOD**  240 tablet  0   No current facility-administered medications on file prior to visit.    Allergies  Allergen Reactions  . Cephalexin     REACTION: GI  distress  . Penicillins     REACTION: rash only  . Pregabalin     REACTION: urinary retention    Past Medical History  Diagnosis Date  . Duchenne muscular dystrophy     with associated cardiomyopathy  . IBS (irritable bowel syndrome)   . Neuropathy     Past Surgical History  Procedure Laterality Date  . Pneumonia  6/02    Resp failure--J tube for a while then removed  . Scoliosis repair  ~1991    Thoracic--with Harrington rods  . Abdominal distention  4/05    Laparotomy negative  . Pneumonia  11/06    Family History  Problem Relation Age of Onset  . Diabetes Neg Hx   . Heart disease Neg Hx   . Hypertension Neg Hx   . Cancer Neg Hx     History   Social History  . Marital Status: Single    Spouse Name: N/A    Number of Children: 0  . Years of Education: N/A   Occupational History  . Diabled    Social History Main Topics  . Smoking status: Never Smoker   . Smokeless tobacco: Never Used  . Alcohol Use: No  .  Drug Use: No  . Sexually Active:    Other Topics Concern  . 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  Alert in motorized chair  Neck: No thyromegaly present.  Cardiovascular: Normal rate, regular rhythm and normal heart sounds.  Exam reveals no gallop.   No murmur heard. Pulmonary/Chest: Effort normal and breath sounds normal. No respiratory distress. He has no wheezes. He has no rales.  Abdominal: Soft. There is no tenderness.  Musculoskeletal:  Some contractures at knees, ankles and hands  Lymphadenopathy:    He has no cervical adenopathy.  Neurological:  Decreased tone  Psychiatric: He has a normal mood and affect. His behavior is normal.          Assessment & Plan:

## 2012-05-20 NOTE — Assessment & Plan Note (Signed)
Totally disabled other than cognitive status Has involvement with heart and bladder as well Continues with 24 hour care

## 2012-05-20 NOTE — Assessment & Plan Note (Signed)
Improved now Better with no constipation Needs some manual compression of bladder to empty

## 2012-05-21 DIAGNOSIS — G7109 Other specified muscular dystrophies: Secondary | ICD-10-CM

## 2012-05-21 DIAGNOSIS — Z930 Tracheostomy status: Secondary | ICD-10-CM

## 2012-06-29 ENCOUNTER — Telehealth: Payer: Self-pay | Admitting: *Deleted

## 2012-06-29 NOTE — Telephone Encounter (Signed)
Messages left on cell and home numbers

## 2012-06-29 NOTE — Telephone Encounter (Signed)
Per Rosaria Ferries pt's father would like a return phone call from Dr. Silvio Pate

## 2012-06-30 NOTE — Telephone Encounter (Signed)
Having more difficulty voiding---even after pushing on bladder Having retention of up to 76cc  Urologist had recommended Foley ---I told him I think it is time that he needs this permanently He will call urologist and get orders from him

## 2012-07-24 DIAGNOSIS — G7109 Other specified muscular dystrophies: Secondary | ICD-10-CM | POA: Diagnosis not present

## 2012-07-24 DIAGNOSIS — Z930 Tracheostomy status: Secondary | ICD-10-CM

## 2012-07-29 ENCOUNTER — Encounter: Payer: Self-pay | Admitting: Internal Medicine

## 2012-07-29 ENCOUNTER — Ambulatory Visit: Payer: Medicare Other | Admitting: Internal Medicine

## 2012-07-29 VITALS — BP 90/50 | HR 108 | Resp 14

## 2012-07-29 DIAGNOSIS — G7109 Other specified muscular dystrophies: Secondary | ICD-10-CM | POA: Diagnosis not present

## 2012-07-29 DIAGNOSIS — R109 Unspecified abdominal pain: Secondary | ICD-10-CM

## 2012-07-29 DIAGNOSIS — R339 Retention of urine, unspecified: Secondary | ICD-10-CM

## 2012-07-29 DIAGNOSIS — G589 Mononeuropathy, unspecified: Secondary | ICD-10-CM

## 2012-07-29 DIAGNOSIS — I429 Cardiomyopathy, unspecified: Secondary | ICD-10-CM

## 2012-07-29 DIAGNOSIS — G479 Sleep disorder, unspecified: Secondary | ICD-10-CM

## 2012-07-29 MED ORDER — ESZOPICLONE 1 MG PO TABS: 1.0000 mg | ORAL_TABLET | Freq: Every evening | ORAL | Status: DC | PRN

## 2012-07-29 NOTE — Assessment & Plan Note (Signed)
Still satisfied with tramadol at night

## 2012-07-29 NOTE — Assessment & Plan Note (Signed)
Persistently tachycardic but no dyspnea now

## 2012-07-29 NOTE — Assessment & Plan Note (Signed)
Has been worsening If he needs indwelling catheter, suprapubic is probably best choice I discussed the possibility of straight cath nightly to be sure bladder is empty (with lidocaine gel)----he wasn't excited about this Seeing urologist tomorrow

## 2012-07-29 NOTE — Assessment & Plan Note (Signed)
Multiple failures Interested in trying lunesta---will try low dose

## 2012-07-29 NOTE — Assessment & Plan Note (Signed)
Likely mostly from gastroparesis Feels he is some better with the simethicone

## 2012-07-29 NOTE — Assessment & Plan Note (Signed)
End stage but stable Total care with 24/7 nurse coverage Ventilator dependent

## 2012-07-29 NOTE — Progress Notes (Signed)
Subjective:    Patient ID: [name], male    DOB: 18-Dec-1980, 32 y.o.   MRN: [id number]  HPI Nurse is here as usual  Doing okay Not happy with the trazodone---didn't seem to help Not taking the alprazolam either Is interested in trying lunesta  No longer taking ibuprofen Uses excedrin rarely and tylenol once in a while  Now taking 4 simethicone bid Really seems to be helping the gas Bowels are okay  Still up in chair from 2-5 PM Eats twice when in chair Will occ have bedtime snack like yogurt or cheese and crackers (that he can handle while supine)  Breathing has been good No dyspnea No cough  Still having trouble voiding Needs manual pressure and still won't go then Has prn straight cath order but he prefers not to get it (has 59 [place] cudee) He is interested in suprapubic catheter  Current Outpatient Prescriptions on File Prior to Visit  Medication Sig Dispense Refill  . acetaminophen (TYLENOL) 500 MG tablet Take 500 mg by mouth every 6 (six) hours as needed.        . Ascorbic Acid (VITAMIN C) 500 MG tablet Take 500 mg by mouth daily.        . Cholecalciferol (VITAMIN D) 2000 UNITS CAPS Take 1 capsule by mouth daily.        Marland Kitchen ibuprofen (ADVIL,MOTRIN) 200 MG tablet Take 200 mg by mouth every 6 (six) hours as needed.       . Multiple Vitamins-Minerals (MULTIVITAMIN WITH MINERALS) tablet Take 1 tablet by mouth daily.        . polyethylene glycol (MIRALAX / GLYCOLAX) packet Take 17 g by mouth daily.        . traMADol (ULTRAM) 50 MG tablet TAKE 1/2 TO 1 TABLET BY MOUTH EVERY 4 TO 6 HOURS **NOT TO EXCEED 8 TABLETS IN A 24 HOUR PERIOD**  240 tablet  0   No current facility-administered medications on file prior to visit.    Allergies  Allergen Reactions  . Cephalexin     REACTION: GI distress  . Penicillins     REACTION: rash only  . Pregabalin     REACTION: urinary retention    Past Medical History  Diagnosis Date  . Duchenne muscular dystrophy     with  associated cardiomyopathy  . IBS (irritable bowel syndrome)   . Neuropathy     Past Surgical History  Procedure Laterality Date  . Pneumonia  6/02    Resp failure--J tube for a while then removed  . Scoliosis repair  ~1991    Thoracic--with Harrington rods  . Abdominal distention  4/05    Laparotomy negative  . Pneumonia  11/06    Family History  Problem Relation Age of Onset  . Diabetes Neg Hx   . Heart disease Neg Hx   . Hypertension Neg Hx   . Cancer Neg Hx     History   Social History  . Marital Status: Single    Spouse Name: N/A    Number of Children: 0  . Years of Education: N/A   Occupational History  . Diabled    Social History Main Topics  . Smoking status: Never Smoker   . Smokeless tobacco: Never Used  . Alcohol Use: No  . Drug Use: No  . Sexually Active:    Other Topics Concern  . Not on file   Social History Narrative       Review of Systems Hasn't been sick  No fever Appetite may be down some---he forces himself to eat Has ongoing right foot pain---lidocaine patch helps this     Objective:   Physical Exam  Constitutional: He is oriented to person, place, and time. No distress.  Generalized severe muscle wasting  Neck: No thyromegaly present.  Cardiovascular: Regular rhythm and normal heart sounds.  Exam reveals no gallop.   No murmur heard. Tachycardic per usual ?soft systolic murmur  Pulmonary/Chest: Effort normal and breath sounds normal. No respiratory distress. He has no wheezes. He has no rales.  No independent respiratory effort  Abdominal: Soft. There is no tenderness.  Musculoskeletal:  Mild contractures of elbows and left knee  Lymphadenopathy:    He has no cervical adenopathy.  Neurological: He is alert and oriented to person, place, and time.  Hypotonic extremities  Psychiatric: He has a normal mood and affect. His behavior is normal.          Assessment & Plan:

## 2012-07-30 DIAGNOSIS — R339 Retention of urine, unspecified: Secondary | ICD-10-CM | POA: Diagnosis not present

## 2012-07-30 HISTORY — PX: SUPRAPUBIC CATHETER INSERTION: SUR719

## 2012-08-03 ENCOUNTER — Other Ambulatory Visit: Payer: Self-pay | Admitting: Internal Medicine

## 2012-08-04 NOTE — Telephone Encounter (Signed)
Please check with [place]'s dad or nurse I had prescribed $RemoveBefore'1mg'tBmlQsboybjPP$  dose Did he have to go up to $Rem'2mg'saeR$ ?  If so, okay to fill $Remov'2mg'OLcBNF$  #30 x 1

## 2012-08-04 NOTE — Telephone Encounter (Signed)
It should be the $Remo'1mg'jkqiU$ , the 2 mg was last prescribed in 2011 by Dr. Hennie Duos, they never received the 07/29/2012 refill request, I verbally gave to pharmacist today.

## 2012-08-13 DIAGNOSIS — G7109 Other specified muscular dystrophies: Secondary | ICD-10-CM | POA: Diagnosis not present

## 2012-08-13 DIAGNOSIS — M25559 Pain in unspecified hip: Secondary | ICD-10-CM | POA: Diagnosis not present

## 2012-08-13 DIAGNOSIS — H9209 Otalgia, unspecified ear: Secondary | ICD-10-CM | POA: Diagnosis not present

## 2012-08-13 DIAGNOSIS — IMO0001 Reserved for inherently not codable concepts without codable children: Secondary | ICD-10-CM | POA: Diagnosis not present

## 2012-08-13 DIAGNOSIS — M545 Low back pain: Secondary | ICD-10-CM | POA: Diagnosis not present

## 2012-08-17 ENCOUNTER — Other Ambulatory Visit: Payer: Self-pay

## 2012-08-17 DIAGNOSIS — H9209 Otalgia, unspecified ear: Secondary | ICD-10-CM | POA: Diagnosis not present

## 2012-08-17 DIAGNOSIS — M545 Low back pain: Secondary | ICD-10-CM | POA: Diagnosis not present

## 2012-08-17 DIAGNOSIS — M25559 Pain in unspecified hip: Secondary | ICD-10-CM | POA: Diagnosis not present

## 2012-08-17 DIAGNOSIS — G7109 Other specified muscular dystrophies: Secondary | ICD-10-CM | POA: Diagnosis not present

## 2012-08-17 DIAGNOSIS — IMO0001 Reserved for inherently not codable concepts without codable children: Secondary | ICD-10-CM | POA: Diagnosis not present

## 2012-08-17 MED ORDER — TRAMADOL HCL 50 MG PO TABS: ORAL_TABLET | ORAL | Status: DC

## 2012-08-17 NOTE — Telephone Encounter (Signed)
rx sent to pharmacy by e-script  

## 2012-08-17 NOTE — Telephone Encounter (Signed)
Okay #[phone] x [phone]

## 2012-08-17 NOTE — Telephone Encounter (Signed)
Arther Dames, nurse left v/m that pt needs refill Tramadol to [facility].Please advise.

## 2012-08-27 DIAGNOSIS — N319 Neuromuscular dysfunction of bladder, unspecified: Secondary | ICD-10-CM | POA: Diagnosis not present

## 2012-08-28 ENCOUNTER — Encounter: Payer: Self-pay | Admitting: Internal Medicine

## 2012-09-02 ENCOUNTER — Other Ambulatory Visit: Payer: Self-pay | Admitting: Internal Medicine

## 2012-09-02 NOTE — Telephone Encounter (Signed)
Okay to refill for a year 

## 2012-09-02 NOTE — Telephone Encounter (Signed)
rx sent to pharmacy by e-script  

## 2012-09-10 DIAGNOSIS — N319 Neuromuscular dysfunction of bladder, unspecified: Secondary | ICD-10-CM | POA: Diagnosis not present

## 2012-09-21 DIAGNOSIS — G7109 Other specified muscular dystrophies: Secondary | ICD-10-CM

## 2012-09-21 DIAGNOSIS — Z930 Tracheostomy status: Secondary | ICD-10-CM

## 2012-09-24 ENCOUNTER — Telehealth: Payer: Self-pay

## 2012-09-24 NOTE — Telephone Encounter (Signed)
pts father, Jenny Reichmann left v/m that for couple of months Miralax is not as effective and request different med for constipation and stomach pain.pt does not want take OTC such as Metamucil.[facility].

## 2012-09-24 NOTE — Telephone Encounter (Signed)
Spoke with dad and advised results, he will try and let us know how it works

## 2012-09-24 NOTE — Telephone Encounter (Signed)
Only other med is lactulose that is more likely to cause gas May need to try higher dose of miralax (unless that then causes diarrhea)  I like senna-s 1-2 tabs daily or twice a day also

## 2012-10-01 DIAGNOSIS — G609 Hereditary and idiopathic neuropathy, unspecified: Secondary | ICD-10-CM | POA: Diagnosis not present

## 2012-10-01 DIAGNOSIS — G7109 Other specified muscular dystrophies: Secondary | ICD-10-CM | POA: Diagnosis not present

## 2012-10-01 DIAGNOSIS — N319 Neuromuscular dysfunction of bladder, unspecified: Secondary | ICD-10-CM | POA: Diagnosis not present

## 2012-10-12 ENCOUNTER — Telehealth: Payer: Self-pay | Admitting: Internal Medicine

## 2012-10-12 NOTE — Telephone Encounter (Signed)
Discussed with Dad I will tentatively reschedule for 7/30---likely around 3:30pm again

## 2012-10-12 NOTE — Telephone Encounter (Signed)
Pt's father called and said there is a home appmt for pt on Wednesday, however pt has an appmt in [place] and the home visit needs to be rescheduled. Could you please call Pt's father? Thank you.

## 2012-10-14 DIAGNOSIS — Z930 Tracheostomy status: Secondary | ICD-10-CM | POA: Diagnosis not present

## 2012-10-14 DIAGNOSIS — G7109 Other specified muscular dystrophies: Secondary | ICD-10-CM | POA: Diagnosis not present

## 2012-10-14 DIAGNOSIS — Z880 Allergy status to penicillin: Secondary | ICD-10-CM | POA: Diagnosis not present

## 2012-10-14 DIAGNOSIS — J9610 Chronic respiratory failure, unspecified whether with hypoxia or hypercapnia: Secondary | ICD-10-CM | POA: Diagnosis not present

## 2012-10-14 DIAGNOSIS — G609 Hereditary and idiopathic neuropathy, unspecified: Secondary | ICD-10-CM | POA: Diagnosis not present

## 2012-10-14 DIAGNOSIS — Z888 Allergy status to other drugs, medicaments and biological substances status: Secondary | ICD-10-CM | POA: Diagnosis not present

## 2012-10-14 DIAGNOSIS — J9509 Other tracheostomy complication: Secondary | ICD-10-CM | POA: Diagnosis not present

## 2012-10-27 ENCOUNTER — Ambulatory Visit: Payer: Medicare Other | Admitting: Internal Medicine

## 2012-10-27 ENCOUNTER — Encounter: Payer: Self-pay | Admitting: Internal Medicine

## 2012-10-27 VITALS — BP 90/50 | HR 106 | Temp 96.8°F | Resp 14

## 2012-10-27 DIAGNOSIS — I429 Cardiomyopathy, unspecified: Secondary | ICD-10-CM

## 2012-10-27 DIAGNOSIS — R339 Retention of urine, unspecified: Secondary | ICD-10-CM

## 2012-10-27 DIAGNOSIS — G7109 Other specified muscular dystrophies: Secondary | ICD-10-CM | POA: Diagnosis not present

## 2012-10-27 DIAGNOSIS — G589 Mononeuropathy, unspecified: Secondary | ICD-10-CM | POA: Diagnosis not present

## 2012-10-27 DIAGNOSIS — R109 Unspecified abdominal pain: Secondary | ICD-10-CM

## 2012-10-27 MED ORDER — TRAMADOL HCL 50 MG PO TABS: ORAL_TABLET | ORAL | Status: DC

## 2012-10-27 NOTE — Assessment & Plan Note (Signed)
Resolved with the suprapubic catheter

## 2012-10-27 NOTE — Assessment & Plan Note (Signed)
Some help with the tramadol Considered the 24 hour but not covered and too expensive Rx renewed at pharmacy

## 2012-10-27 NOTE — Assessment & Plan Note (Signed)
Usual tachycardia No CHF

## 2012-10-27 NOTE — Progress Notes (Signed)
Subjective:    Patient ID: [name], male    DOB: 1980-05-14, 32 y.o.   MRN: [id number]  HPI Dad and nurse are here  Doing well Had suprapubic catheter put in at the end of May--at [facility] [place] well and works well No more emptying problems  Bowels still slow at times Only using the miralax prn  Has been using the lunesta This helps sleep but not every night Just takes one  No trouble with breathing Rate still at 14---no breathless feelings No cough or fever  Abdominal pain continues Simethicone can help gas  Tramadol helps the neuropathy Seems more of an issue since the warmer weather came [facility] about a longer acting preparation  Current Outpatient Prescriptions on File Prior to Visit  Medication Sig Dispense Refill  . acetaminophen (TYLENOL) 500 MG tablet Take 500 mg by mouth every 6 (six) hours as needed.        . Ascorbic Acid (VITAMIN C) 500 MG tablet Take 500 mg by mouth daily.        Marland Kitchen aspirin-acetaminophen-caffeine (EXCEDRIN MIGRAINE) 250-250-65 MG per tablet Take 1 tablet by mouth every 6 (six) hours as needed for pain.      . Cholecalciferol (VITAMIN D) 2000 UNITS CAPS Take 1 capsule by mouth daily.        . eszopiclone (LUNESTA) 1 MG TABS Take 1 tablet (1 mg total) by mouth at bedtime as needed. Take immediately before bedtime  30 tablet  3  . ibuprofen (ADVIL,MOTRIN) 200 MG tablet Take 200 mg by mouth every 6 (six) hours as needed.       Marland Kitchen LIDODERM 5 % APPLY 1 PATCH ON PAINFUL AREA 12 HOURS ON AND 12 HOURS OFF  30 patch  11  . Multiple Vitamins-Minerals (MULTIVITAMIN WITH MINERALS) tablet Take 1 tablet by mouth daily.        . polyethylene glycol (MIRALAX / GLYCOLAX) packet Take 17 g by mouth daily as needed.       . simethicone (MYLICON) 80 MG chewable tablet Chew 320 mg by mouth 2 (two) times daily.      . traMADol (ULTRAM) 50 MG tablet TAKE 1/2 TO 1 TABLET BY MOUTH EVERY 4 TO 6 HOURS **NOT TO EXCEED 8 TABLETS IN A 24 HOUR PERIOD**  240 tablet  0   No  current facility-administered medications on file prior to visit.    Allergies  Allergen Reactions  . Cephalexin     REACTION: GI distress  . Penicillins     REACTION: rash only  . Pregabalin     REACTION: urinary retention    Past Medical History  Diagnosis Date  . Duchenne muscular dystrophy     with associated cardiomyopathy  . IBS (irritable bowel syndrome)   . Neuropathy     Past Surgical History  Procedure Laterality Date  . Pneumonia  6/02    Resp failure--J tube for a while then removed  . Scoliosis repair  ~1991    Thoracic--with Harrington rods  . Abdominal distention  4/05    Laparotomy negative  . Pneumonia  11/06  . Suprapubic catheter insertion  5/14    [facility]  . Suprapubic catheter insertion  5/14    Family History  Problem Relation Age of Onset  . Diabetes Neg Hx   . Heart disease Neg Hx   . Hypertension Neg Hx   . Cancer Neg Hx     History   Social History  . Marital Status: Single  Spouse Name: N/A    Number of Children: 0  . Years of Education: N/A   Occupational History  . Diabled    Social History Main Topics  . Smoking status: Never Smoker   . Smokeless tobacco: Never Used  . Alcohol Use: No  . Drug Use: No  . Sexually Active:    Other Topics Concern  . Not on file   Social History Narrative       Review of Systems Appetite is off some Weight seems stable though     Objective:   Physical Exam  Constitutional: No distress.  Alert in motorized chair  Neck: No thyromegaly present.  Cardiovascular: Regular rhythm and normal heart sounds.  Exam reveals no gallop.   No murmur heard. Usual tachycardia  Pulmonary/Chest: Effort normal and breath sounds normal. No respiratory distress. He has no wheezes. He has no rales.  Abdominal: Soft. There is no tenderness.  Musculoskeletal: He exhibits no edema.  contractures  Lymphadenopathy:    He has no cervical adenopathy.  Neurological: He exhibits abnormal muscle tone.  No  active movement  Psychiatric: He has a normal mood and affect. His behavior is normal.          Assessment & Plan:

## 2012-10-27 NOTE — Assessment & Plan Note (Signed)
Constipation Gas with miralax---will try lower dose and add senekot-s May need enema

## 2012-10-27 NOTE — Assessment & Plan Note (Signed)
[facility] Bed/wheelchair bound Cardiomyopathy, bowel problems, bladder as well

## 2012-10-28 ENCOUNTER — Encounter: Payer: Self-pay | Admitting: Internal Medicine

## 2012-11-16 ENCOUNTER — Other Ambulatory Visit: Payer: Self-pay | Admitting: Internal Medicine

## 2012-11-16 NOTE — Telephone Encounter (Signed)
Per the 10/27/2012 note this was called in?

## 2012-11-16 NOTE — Telephone Encounter (Signed)
I did call it in to the pharmacist while I was there Please find out what happened If that one wasn't filled, okay to approve it now

## 2012-11-17 NOTE — Telephone Encounter (Signed)
Because Tramadol has went to a narcotic the refill remaining needed to be phoned in it was void. rx called into pharmacy

## 2012-11-18 DIAGNOSIS — G7109 Other specified muscular dystrophies: Secondary | ICD-10-CM

## 2012-11-18 DIAGNOSIS — R338 Other retention of urine: Secondary | ICD-10-CM

## 2012-11-18 DIAGNOSIS — Z930 Tracheostomy status: Secondary | ICD-10-CM

## 2012-12-08 ENCOUNTER — Other Ambulatory Visit: Payer: Self-pay | Admitting: Internal Medicine

## 2012-12-09 NOTE — Telephone Encounter (Signed)
Okay #30 x 3

## 2012-12-09 NOTE — Telephone Encounter (Signed)
rx called into pharmacy

## 2012-12-18 ENCOUNTER — Telehealth: Payer: Self-pay | Admitting: Internal Medicine

## 2012-12-18 MED ORDER — AZITHROMYCIN 200 MG/5ML PO SUSR: 400.0000 mg | Freq: Every day | ORAL | Status: AC

## 2012-12-18 NOTE — Telephone Encounter (Signed)
Phone call from dad  He has been sick for a couple of days Increasing thick yellow tenacious sputum Now with fever 100+  Will start empiric antibiotic Hopefully should be improving by 2-3 days

## 2013-01-01 DIAGNOSIS — Z466 Encounter for fitting and adjustment of urinary device: Secondary | ICD-10-CM | POA: Diagnosis not present

## 2013-01-01 DIAGNOSIS — R339 Retention of urine, unspecified: Secondary | ICD-10-CM | POA: Diagnosis not present

## 2013-01-01 DIAGNOSIS — Z435 Encounter for attention to cystostomy: Secondary | ICD-10-CM | POA: Diagnosis not present

## 2013-01-06 ENCOUNTER — Ambulatory Visit: Payer: Medicare Other | Admitting: Internal Medicine

## 2013-01-06 ENCOUNTER — Encounter: Payer: Self-pay | Admitting: Internal Medicine

## 2013-01-06 VITALS — BP 90/54 | HR 94 | Resp 14 | Wt <= 1120 oz

## 2013-01-06 DIAGNOSIS — I429 Cardiomyopathy, unspecified: Secondary | ICD-10-CM

## 2013-01-06 DIAGNOSIS — R0609 Other forms of dyspnea: Secondary | ICD-10-CM | POA: Diagnosis not present

## 2013-01-06 DIAGNOSIS — R0689 Other abnormalities of breathing: Secondary | ICD-10-CM

## 2013-01-06 DIAGNOSIS — G7109 Other specified muscular dystrophies: Secondary | ICD-10-CM

## 2013-01-06 DIAGNOSIS — R339 Retention of urine, unspecified: Secondary | ICD-10-CM | POA: Diagnosis not present

## 2013-01-06 DIAGNOSIS — G7101 Duchenne or Becker muscular dystrophy: Secondary | ICD-10-CM

## 2013-01-06 DIAGNOSIS — G589 Mononeuropathy, unspecified: Secondary | ICD-10-CM

## 2013-01-06 DIAGNOSIS — G479 Sleep disorder, unspecified: Secondary | ICD-10-CM

## 2013-01-06 NOTE — Assessment & Plan Note (Signed)
Actually not tachycardic today!

## 2013-01-06 NOTE — Assessment & Plan Note (Signed)
End stage but stable [facility] 24/7 nursing

## 2013-01-06 NOTE — Assessment & Plan Note (Signed)
Mild chronic discomfort with the suprapubic catheter Discussed that this is likely much better than a foley would be

## 2013-01-06 NOTE — Assessment & Plan Note (Signed)
Respiratory status stable on rate of 14 No changes needed

## 2013-01-06 NOTE — Assessment & Plan Note (Signed)
Tramadol reasonably effective

## 2013-01-06 NOTE — Assessment & Plan Note (Signed)
Likes the [facility]

## 2013-01-06 NOTE — Progress Notes (Signed)
Subjective:    Patient ID: [name], male    DOB: [birth year], 32 y.o.   MRN: [id number]  HPI Seen with his regular RN  Doing fairly well Recently had suprapubic catheter changed This is still working well  Slight discomfort after the change over---some drainage and discomfort chronically  Still gets some bloating Not happy with the miralax Trying citrucel and docusate for now Bowels remain slow  Appetite is about the same Never that big---he forces himself to eat 2 meals Sometimes will eat a bedtime snack  Still gets out of bed once a day No lift needed due to light weight In motorized chair around 1-6PM  Breathing still okay No dyspnea No regular cough now Did have the zithromax about 3 weeks ago----cleared up the yellow mucus  Current Outpatient Prescriptions on File Prior to Visit  Medication Sig Dispense Refill  . acetaminophen (TYLENOL) 500 MG tablet Take 500 mg by mouth every 6 (six) hours as needed.        . Ascorbic Acid (VITAMIN C) 500 MG tablet Take 500 mg by mouth daily.        Marland Kitchen aspirin-acetaminophen-caffeine (EXCEDRIN MIGRAINE) 250-250-65 MG per tablet Take 1 tablet by mouth every 6 (six) hours as needed for pain.      . Cholecalciferol (VITAMIN D) 2000 UNITS CAPS Take 1 capsule by mouth daily.        . eszopiclone (LUNESTA) 1 MG TABS tablet TAKE 1 TABLET BY MOUTH AT BEDTIME AS NEEDED (TAKE IMMEDIATELY BEFORE BEDTIME)  30 tablet  3  . ibuprofen (ADVIL,MOTRIN) 200 MG tablet Take 200 mg by mouth every 6 (six) hours as needed.       Marland Kitchen LIDODERM 5 % APPLY 1 PATCH ON PAINFUL AREA 12 HOURS ON AND 12 HOURS OFF  30 patch  11  . Multiple Vitamins-Minerals (MULTIVITAMIN WITH MINERALS) tablet Take 1 tablet by mouth daily.        . polyethylene glycol (MIRALAX / GLYCOLAX) packet Take 17 g by mouth daily as needed.       . simethicone (MYLICON) 80 MG chewable tablet Chew 320 mg by mouth 4 (four) times daily as needed.       . traMADol (ULTRAM) 50 MG tablet TAKE 1/2-1  TABLET BY MOUTH EVERY 4-6 HOURS MAX 8 A DAY  240 tablet  0   No current facility-administered medications on file prior to visit.    Allergies  Allergen Reactions  . Cephalexin     REACTION: GI distress  . Penicillins     REACTION: rash only  . Pregabalin     REACTION: urinary retention    Past Medical History  Diagnosis Date  . Duchenne muscular dystrophy     with associated cardiomyopathy  . IBS (irritable bowel syndrome)   . Neuropathy     Past Surgical History  Procedure Laterality Date  . Pneumonia  6/02    Resp failure--J tube for a while then removed  . Scoliosis repair  ~1991    Thoracic--with Harrington rods  . Abdominal distention  4/05    Laparotomy negative  . Pneumonia  11/06  . Suprapubic catheter insertion  5/14    [facility]  . Suprapubic catheter insertion  5/14    Family History  Problem Relation Age of Onset  . Diabetes Neg Hx   . Heart disease Neg Hx   . Hypertension Neg Hx   . Cancer Neg Hx     History   Social History  .  Marital Status: Single    Spouse Name: N/A    Number of Children: 0  . Years of Education: N/A   Occupational History  . Diabled    Social History Main Topics  . Smoking status: Never Smoker   . Smokeless tobacco: Never Used  . Alcohol Use: No  . Drug Use: No  . Sexual Activity:    Other Topics Concern  . Not on file   Social History Narrative       Review of Systems No skin lesions or ulcers Had some down feelings about a week ago--- when the rain was around. Some changes in staff--getting used to new RNs Has RNs 24/7    Objective:   Physical Exam  Constitutional: No distress.  Up in motorized chair  Neck: No thyromegaly present.  Cardiovascular: Normal rate, regular rhythm and normal heart sounds.  Exam reveals no gallop.   No murmur heard. Pulmonary/Chest: Effort normal. No respiratory distress. He has no wheezes. He has no rales.  Abdominal: Soft. There is no tenderness.  Musculoskeletal: He  exhibits no edema and no tenderness.  Lymphadenopathy:    He has no cervical adenopathy.  Neurological:  Decreased tone No active movement in extremities Slight hand contractures--- L>R  Psychiatric: He has a normal mood and affect. His behavior is normal.          Assessment & Plan:

## 2013-01-07 DIAGNOSIS — G7109 Other specified muscular dystrophies: Secondary | ICD-10-CM

## 2013-01-07 DIAGNOSIS — R0989 Other specified symptoms and signs involving the circulatory and respiratory systems: Secondary | ICD-10-CM

## 2013-01-07 DIAGNOSIS — R339 Retention of urine, unspecified: Secondary | ICD-10-CM | POA: Diagnosis not present

## 2013-01-07 DIAGNOSIS — R0609 Other forms of dyspnea: Secondary | ICD-10-CM

## 2013-01-07 DIAGNOSIS — I429 Cardiomyopathy, unspecified: Secondary | ICD-10-CM

## 2013-01-18 DIAGNOSIS — G7109 Other specified muscular dystrophies: Secondary | ICD-10-CM | POA: Diagnosis not present

## 2013-01-21 DIAGNOSIS — R338 Other retention of urine: Secondary | ICD-10-CM

## 2013-01-21 DIAGNOSIS — Z930 Tracheostomy status: Secondary | ICD-10-CM

## 2013-01-21 DIAGNOSIS — G7109 Other specified muscular dystrophies: Secondary | ICD-10-CM

## 2013-01-23 ENCOUNTER — Telehealth: Payer: Self-pay | Admitting: Internal Medicine

## 2013-01-23 MED ORDER — PROMETHAZINE HCL 12.5 MG PO TABS: 12.5000 mg | ORAL_TABLET | Freq: Three times a day (TID) | ORAL | Status: DC | PRN

## 2013-01-23 NOTE — Telephone Encounter (Signed)
Call from father last night Had bout of constipation and then got nauseated Usually has some phenergan for this but ran out  Discussed using the miralax more to clear him out Will send refill on the phenergan

## 2013-02-11 DIAGNOSIS — G7109 Other specified muscular dystrophies: Secondary | ICD-10-CM | POA: Diagnosis not present

## 2013-02-11 DIAGNOSIS — R011 Cardiac murmur, unspecified: Secondary | ICD-10-CM | POA: Diagnosis not present

## 2013-03-04 ENCOUNTER — Telehealth: Payer: Self-pay

## 2013-03-04 NOTE — Telephone Encounter (Addendum)
[name] said pt has supra catheter for urination and pt is not able to urinate; [name] trying to flush tube with 10cc saline; comes out stoma but not catheter. Pt in extreme pain. What to do? [name] wants to speak with Dr Silvio Pate. [name] request cb at [phone].

## 2013-03-04 NOTE — Telephone Encounter (Signed)
It appears the suture holding it got loose and balloon migrated out He has deflated the balloon, repositioned the catheter and reinflated the balloon ---I told him that is correct He will try to flush again If not able to get urine flow-- will bring him to the ER

## 2013-03-08 DIAGNOSIS — R339 Retention of urine, unspecified: Secondary | ICD-10-CM | POA: Diagnosis not present

## 2013-03-18 DIAGNOSIS — G7109 Other specified muscular dystrophies: Secondary | ICD-10-CM

## 2013-03-18 DIAGNOSIS — R338 Other retention of urine: Secondary | ICD-10-CM | POA: Diagnosis not present

## 2013-03-18 DIAGNOSIS — Z930 Tracheostomy status: Secondary | ICD-10-CM | POA: Diagnosis not present

## 2013-03-19 DIAGNOSIS — G7109 Other specified muscular dystrophies: Secondary | ICD-10-CM | POA: Diagnosis not present

## 2013-03-19 DIAGNOSIS — IMO0001 Reserved for inherently not codable concepts without codable children: Secondary | ICD-10-CM | POA: Diagnosis not present

## 2013-03-19 DIAGNOSIS — G609 Hereditary and idiopathic neuropathy, unspecified: Secondary | ICD-10-CM | POA: Diagnosis not present

## 2013-03-19 DIAGNOSIS — K589 Irritable bowel syndrome without diarrhea: Secondary | ICD-10-CM | POA: Diagnosis not present

## 2013-04-05 DIAGNOSIS — K589 Irritable bowel syndrome without diarrhea: Secondary | ICD-10-CM | POA: Diagnosis not present

## 2013-04-05 DIAGNOSIS — G609 Hereditary and idiopathic neuropathy, unspecified: Secondary | ICD-10-CM | POA: Diagnosis not present

## 2013-04-05 DIAGNOSIS — G7109 Other specified muscular dystrophies: Secondary | ICD-10-CM | POA: Diagnosis not present

## 2013-04-05 DIAGNOSIS — IMO0001 Reserved for inherently not codable concepts without codable children: Secondary | ICD-10-CM | POA: Diagnosis not present

## 2013-04-06 ENCOUNTER — Telehealth: Payer: Self-pay | Admitting: *Deleted

## 2013-04-06 NOTE — Telephone Encounter (Signed)
Received prior auth request for Lidoderm. Auth paperwork obtained and placed in your inbox.

## 2013-04-07 ENCOUNTER — Ambulatory Visit: Payer: Medicare Other | Admitting: Internal Medicine

## 2013-04-07 ENCOUNTER — Encounter: Payer: Self-pay | Admitting: Internal Medicine

## 2013-04-07 VITALS — BP 90/52 | HR 124 | Temp 96.4°F | Resp 14

## 2013-04-07 DIAGNOSIS — R532 Functional quadriplegia: Secondary | ICD-10-CM | POA: Diagnosis not present

## 2013-04-07 DIAGNOSIS — M24573 Contracture, unspecified ankle: Secondary | ICD-10-CM

## 2013-04-07 DIAGNOSIS — G7109 Other specified muscular dystrophies: Secondary | ICD-10-CM

## 2013-04-07 DIAGNOSIS — G7101 Duchenne or Becker muscular dystrophy: Secondary | ICD-10-CM

## 2013-04-07 DIAGNOSIS — I429 Cardiomyopathy, unspecified: Secondary | ICD-10-CM

## 2013-04-07 DIAGNOSIS — G589 Mononeuropathy, unspecified: Secondary | ICD-10-CM

## 2013-04-07 DIAGNOSIS — M24576 Contracture, unspecified foot: Secondary | ICD-10-CM

## 2013-04-07 DIAGNOSIS — R339 Retention of urine, unspecified: Secondary | ICD-10-CM

## 2013-04-07 DIAGNOSIS — K3184 Gastroparesis: Secondary | ICD-10-CM

## 2013-04-07 DIAGNOSIS — Z9911 Dependence on respirator [ventilator] status: Secondary | ICD-10-CM | POA: Diagnosis not present

## 2013-04-07 DIAGNOSIS — F39 Unspecified mood [affective] disorder: Secondary | ICD-10-CM | POA: Insufficient documentation

## 2013-04-07 DIAGNOSIS — G8250 Quadriplegia, unspecified: Secondary | ICD-10-CM | POA: Insufficient documentation

## 2013-04-07 DIAGNOSIS — I43 Cardiomyopathy in diseases classified elsewhere: Secondary | ICD-10-CM

## 2013-04-07 MED ORDER — TRAMADOL HCL 50 MG PO TABS: 50.0000 mg | ORAL_TABLET | Freq: Every day | ORAL | Status: DC

## 2013-04-07 NOTE — Progress Notes (Signed)
Subjective:    Patient ID: [name], male    DOB: May 06, 1980, 33 y.o.   MRN: [id number]  HPI RN is here  Still has suprapubic catheter Due for another change in urologist's office next week Dad will be along and hopefully be able to change it from then on Still hasn't been easy to change Still very sensitive and tender If bowels are distended, it will press against the stoma Still works well--no problems with retention now  Has had alprazolam ER in past--to be used at bedtime prn He and RN feel it would benefit him at other times Had done well with the 0.$RemoveBe'5mg'sOMkhtFeB$  dose Gets anxiety feeling  Bowels still slow Generally okay with the miralax and stool softener Still gets a lot of gas at times GI doctor has recommended Fleet's every few days---he just can't tolerate that  Pain is reasonably controlled on the tramadol Just uses it at bedtime  No chest pain No SOB  Current Outpatient Prescriptions on File Prior to Visit  Medication Sig Dispense Refill  . acetaminophen (TYLENOL) 500 MG tablet Take 500 mg by mouth every 6 (six) hours as needed.        . Ascorbic Acid (VITAMIN C) 500 MG tablet Take 500 mg by mouth daily.        Marland Kitchen aspirin-acetaminophen-caffeine (EXCEDRIN MIGRAINE) 250-250-65 MG per tablet Take 1 tablet by mouth every 6 (six) hours as needed for pain.      . Cholecalciferol (VITAMIN D) 2000 UNITS CAPS Take 1 capsule by mouth daily.        Marland Kitchen docusate sodium (COLACE) 100 MG capsule Take 100 mg by mouth daily as needed for constipation.      . eszopiclone (LUNESTA) 1 MG TABS tablet TAKE 1 TABLET BY MOUTH AT BEDTIME AS NEEDED (TAKE IMMEDIATELY BEFORE BEDTIME)  30 tablet  3  . ibuprofen (ADVIL,MOTRIN) 200 MG tablet Take 200 mg by mouth every 6 (six) hours as needed.       Marland Kitchen LIDODERM 5 % APPLY 1 PATCH ON PAINFUL AREA 12 HOURS ON AND 12 HOURS OFF  30 patch  11  . Multiple Vitamins-Minerals (MULTIVITAMIN WITH MINERALS) tablet Take 1 tablet by mouth daily.        .  polyethylene glycol (MIRALAX / GLYCOLAX) packet Take 17 g by mouth daily as needed.       . promethazine (PHENERGAN) 12.5 MG tablet Take 1 tablet (12.5 mg total) by mouth 3 (three) times daily as needed for nausea.  20 tablet  2  . simethicone (MYLICON) 80 MG chewable tablet Chew 320 mg by mouth 4 (four) times daily as needed.        No current facility-administered medications on file prior to visit.    Allergies  Allergen Reactions  . Cephalexin     REACTION: GI distress  . Penicillins     REACTION: rash only  . Pregabalin     REACTION: urinary retention    Past Medical History  Diagnosis Date  . Duchenne muscular dystrophy     with associated cardiomyopathy  . IBS (irritable bowel syndrome)   . Neuropathy     Past Surgical History  Procedure Laterality Date  . Pneumonia  6/02    Resp failure--J tube for a while then removed  . Scoliosis repair  ~1991    Thoracic--with Harrington rods  . Abdominal distention  4/05    Laparotomy negative  . Pneumonia  11/06  . Suprapubic catheter insertion  5/14  [facility]  . Suprapubic catheter insertion  5/14    Family History  Problem Relation Age of Onset  . Diabetes Neg Hx   . Heart disease Neg Hx   . Hypertension Neg Hx   . Cancer Neg Hx     History   Social History  . Marital Status: Single    Spouse Name: N/A    Number of Children: 0  . Years of Education: N/A   Occupational History  . Diabled    Social History Main Topics  . Smoking status: Never Smoker   . Smokeless tobacco: Never Used  . Alcohol Use: No  . Drug Use: No  . Sexual Activity:    Other Topics Concern  . Not on file   Social History Narrative       Review of Systems Sleep is still variable--not great still Still works on the computer Appetite is fairly good--has to force himself sometimes but eats 2 meals per day and bedtime snack at times In bed except for ~5 hour stretch in wheelchair daily    Objective:   Physical Exam    Constitutional: No distress.  Neck: No thyromegaly present.  Trach site clean  Cardiovascular: Regular rhythm and normal heart sounds.  Exam reveals no gallop.   No murmur heard. Usual tachycardia  Pulmonary/Chest: Effort normal and breath sounds normal. No respiratory distress. He has no wheezes. He has no rales.  Abdominal: Soft. There is no tenderness.  Suprapubic site without inflammation  Lymphadenopathy:    He has no cervical adenopathy.  Psychiatric: He has a normal mood and affect. His behavior is normal.          Assessment & Plan:

## 2013-04-07 NOTE — Assessment & Plan Note (Signed)
And other joints Right foot is most painful He gets some relief from the lidoderm

## 2013-04-07 NOTE — Assessment & Plan Note (Signed)
Doing well on current settings Not able to assist the machine at all for some time now

## 2013-04-07 NOTE — Assessment & Plan Note (Signed)
No symptoms Usual tachycardia

## 2013-04-07 NOTE — Telephone Encounter (Signed)
Form done 

## 2013-04-07 NOTE — Assessment & Plan Note (Signed)
[facility] trying to adjust wheelchair so he still has some control

## 2013-04-07 NOTE — Assessment & Plan Note (Signed)
Mostly anxiety Uses the alprazolam occasionally with success

## 2013-04-07 NOTE — Assessment & Plan Note (Signed)
Intestinal also due to the Duchenne's Chronic gas Bowels okay with meds Not clear if erythromycin is helping

## 2013-04-07 NOTE — Assessment & Plan Note (Signed)
Acceptable control on the tramadol

## 2013-04-07 NOTE — Assessment & Plan Note (Signed)
Continues to hold on GI, urologic, cardiac complications but stable

## 2013-04-07 NOTE — Assessment & Plan Note (Signed)
Suprapubic catheter still sensitive--but works fine

## 2013-04-12 DIAGNOSIS — Z466 Encounter for fitting and adjustment of urinary device: Secondary | ICD-10-CM | POA: Diagnosis not present

## 2013-04-14 NOTE — Telephone Encounter (Signed)
Not sure what happened, I faxed the request on 04/08/12 and again today, Aniceto Boss have you seen anything about this? I will call the dad and let him know what's going on.

## 2013-04-14 NOTE — Telephone Encounter (Signed)
Please see if you can find out what happened

## 2013-04-14 NOTE — Telephone Encounter (Signed)
Pt's father called for update on status of prior auth for Lidoderm patches.

## 2013-04-16 NOTE — Telephone Encounter (Signed)
I have no knowledge of our office receiving any response from insurance company.

## 2013-04-21 NOTE — Telephone Encounter (Signed)
Spoke dad and let him know what's going on. I spoke with medicaid and they did receive the request on 04/14/13 and per the call center it is in process. I told her it would be nice if they had called [place] to let us know what's going on. I let the dad know I will call back again tomorrow.

## 2013-04-21 NOTE — Telephone Encounter (Signed)
Okay  Let me know the outcome

## 2013-05-31 DIAGNOSIS — R338 Other retention of urine: Secondary | ICD-10-CM | POA: Diagnosis not present

## 2013-05-31 DIAGNOSIS — Z930 Tracheostomy status: Secondary | ICD-10-CM | POA: Diagnosis not present

## 2013-05-31 DIAGNOSIS — G7109 Other specified muscular dystrophies: Secondary | ICD-10-CM

## 2013-06-03 DIAGNOSIS — G7109 Other specified muscular dystrophies: Secondary | ICD-10-CM | POA: Diagnosis not present

## 2013-06-03 DIAGNOSIS — IMO0001 Reserved for inherently not codable concepts without codable children: Secondary | ICD-10-CM | POA: Diagnosis not present

## 2013-06-03 DIAGNOSIS — G609 Hereditary and idiopathic neuropathy, unspecified: Secondary | ICD-10-CM | POA: Diagnosis not present

## 2013-06-03 DIAGNOSIS — K589 Irritable bowel syndrome without diarrhea: Secondary | ICD-10-CM | POA: Diagnosis not present

## 2013-06-21 DIAGNOSIS — R338 Other retention of urine: Secondary | ICD-10-CM

## 2013-06-21 DIAGNOSIS — Z930 Tracheostomy status: Secondary | ICD-10-CM

## 2013-06-21 DIAGNOSIS — G7109 Other specified muscular dystrophies: Secondary | ICD-10-CM | POA: Diagnosis not present

## 2013-06-30 ENCOUNTER — Ambulatory Visit: Payer: Medicare Other | Admitting: Internal Medicine

## 2013-06-30 ENCOUNTER — Encounter: Payer: Self-pay | Admitting: Internal Medicine

## 2013-06-30 VITALS — BP 94/54 | HR 106 | Temp 97.6°F | Resp 16

## 2013-06-30 DIAGNOSIS — K3184 Gastroparesis: Secondary | ICD-10-CM | POA: Diagnosis not present

## 2013-06-30 DIAGNOSIS — Z9911 Dependence on respirator [ventilator] status: Secondary | ICD-10-CM

## 2013-06-30 DIAGNOSIS — G7101 Duchenne or Becker muscular dystrophy: Secondary | ICD-10-CM

## 2013-06-30 DIAGNOSIS — G7109 Other specified muscular dystrophies: Secondary | ICD-10-CM

## 2013-06-30 DIAGNOSIS — R532 Functional quadriplegia: Secondary | ICD-10-CM | POA: Diagnosis not present

## 2013-06-30 DIAGNOSIS — R339 Retention of urine, unspecified: Secondary | ICD-10-CM

## 2013-06-30 MED ORDER — LINACLOTIDE 145 MCG PO CAPS: 145.0000 ug | ORAL_CAPSULE | Freq: Every day | ORAL | Status: DC

## 2013-06-30 NOTE — Assessment & Plan Note (Signed)
Up in wheelchair 5 hours per day

## 2013-06-30 NOTE — Assessment & Plan Note (Signed)
End stage but stable [facility] Cardiac, GI and bladder complications now

## 2013-06-30 NOTE — Progress Notes (Signed)
Subjective:    Patient ID: [name], male    DOB: 27-Dec-1980, 33 y.o.   MRN: [id number]  HPI RN is here Dad came as well No new concerns  Suprapubic catheter is working well Did have lots of sediment---needed to be changed sooner than 1 month RN and dad are doing the changes Changed to non latex (silicone) catheters Are using acetic acid flushes also  Bowels are better lately Still gets bloating and cramping  Uses the alprazolam rarely Only 3 times in the past month Might be better since the bladder is not acting up as much  Not sleeping well Not using the lunesta lately Does use the tramadol-- 2 of the $Remo'50mg'dgDxS$  at bedtime  In chair about 5 hours per day Bed rest of the time 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 acetaminophen (TYLENOL) 500 MG tablet Take 500 mg by mouth every 6 (six) hours as needed.        . ALPRAZolam (XANAX XR) 0.5 MG 24 hr tablet Take 0.5 mg by mouth daily as needed for anxiety or sleep.      . Ascorbic Acid (VITAMIN C) 500 MG tablet Take 500 mg by mouth daily.        Marland Kitchen aspirin-acetaminophen-caffeine (EXCEDRIN MIGRAINE) 250-250-65 MG per tablet Take 1 tablet by mouth every 6 (six) hours as needed for pain.      . Cholecalciferol (VITAMIN D) 2000 UNITS CAPS Take 1 capsule by mouth daily.        Marland Kitchen docusate sodium (COLACE) 100 MG capsule Take 100 mg by mouth daily as needed for constipation.      Marland Kitchen erythromycin (E-MYCIN) 250 MG tablet Take 250 mg by mouth daily.      . eszopiclone (LUNESTA) 1 MG TABS tablet TAKE 1 TABLET BY MOUTH AT BEDTIME AS NEEDED (TAKE IMMEDIATELY BEFORE BEDTIME)  30 tablet  3  . ibuprofen (ADVIL,MOTRIN) 200 MG tablet Take 200 mg by mouth every 6 (six) hours as needed.       Marland Kitchen LIDODERM 5 % APPLY 1 PATCH ON PAINFUL AREA 12 HOURS ON AND 12 HOURS OFF  30 patch  11  . Magnesium 250 MG TABS Take 1 tablet by mouth daily.       . Multiple Vitamins-Minerals (MULTIVITAMIN WITH MINERALS) tablet Take 1 tablet by mouth daily.        . polyethylene glycol (MIRALAX / GLYCOLAX) packet Take 17 g by mouth daily as needed.       . promethazine (PHENERGAN) 12.5 MG tablet Take 1 tablet (12.5 mg total) by mouth 3 (three) times daily as needed for nausea.  20 tablet  2  . simethicone (MYLICON) 80 MG chewable tablet Chew 320 mg by mouth 4 (four) times daily as needed.       . traMADol (ULTRAM) 50 MG tablet Take 1-2 tablets (50-100 mg total) by mouth at bedtime.  60 tablet  5   No current facility-administered medications on file prior to visit.    Allergies  Allergen Reactions  . Cephalexin     REACTION: GI distress  . Penicillins     REACTION: rash only  . Pregabalin     REACTION: urinary retention    Past Medical History  Diagnosis Date  . Duchenne muscular dystrophy     with associated cardiomyopathy  . IBS (irritable bowel syndrome)   . Neuropathy  Past Surgical History  Procedure Laterality Date  . Pneumonia  6/02    Resp failure--J tube for a while then removed  . Scoliosis repair  ~1991    Thoracic--with Harrington rods  . Abdominal distention  4/05    Laparotomy negative  . Pneumonia  11/06  . Suprapubic catheter insertion  5/14    [facility]  . Suprapubic catheter insertion  5/14    Family History  Problem Relation Age of Onset  . Diabetes Neg Hx   . Heart disease Neg Hx   . Hypertension Neg Hx   . Cancer Neg Hx     History   Social History  . Marital Status: Single    Spouse Name: N/A    Number of Children: 0  . Years of Education: N/A   Occupational History  . Diabled    Social History Main Topics  . Smoking status: Never Smoker   . Smokeless tobacco: Never Used  . Alcohol Use: No  . Drug Use: No  . Sexual Activity:    Other Topics Concern  . Not on file   Social History Narrative       Review of Systems Appetite okay. Has to keep meals small Tries to get 2 meals in  while in chair Occasional snack while in bed    Objective:   Physical Exam  Constitutional: No distress.  Neck: No thyromegaly present.  Cardiovascular: Normal rate, regular rhythm and normal heart sounds.  Exam reveals no gallop.   No murmur heard. Pulmonary/Chest: Effort normal and breath sounds normal. No respiratory distress. He has no wheezes. He has no rales.  Doesn't breathe above vent  Abdominal: He exhibits no distension. There is no tenderness.  Musculoskeletal: He exhibits no edema.  Mild contractures  Lymphadenopathy:    He has no cervical adenopathy.  Neurological:  No active movement Decreased tone  Psychiatric: He has a normal mood and affect. His behavior is normal.          Assessment & Plan:

## 2013-06-30 NOTE — Assessment & Plan Note (Signed)
Doing better with the suprapubic catheter Trouble with sediment May need to change more frequently (can do every 2 weeks prn)

## 2013-06-30 NOTE — Assessment & Plan Note (Signed)
Ongoing severe discomfort Constipation, bloating Has failed many Rx Dad wonders about linzess----okay to give trial

## 2013-06-30 NOTE — Assessment & Plan Note (Signed)
Doesn't breathe above vent No dyspnea on current rate

## 2013-07-19 DIAGNOSIS — R338 Other retention of urine: Secondary | ICD-10-CM | POA: Diagnosis not present

## 2013-07-19 DIAGNOSIS — G7109 Other specified muscular dystrophies: Secondary | ICD-10-CM | POA: Diagnosis not present

## 2013-07-19 DIAGNOSIS — Z930 Tracheostomy status: Secondary | ICD-10-CM | POA: Diagnosis not present

## 2013-07-28 DIAGNOSIS — J9610 Chronic respiratory failure, unspecified whether with hypoxia or hypercapnia: Secondary | ICD-10-CM | POA: Diagnosis not present

## 2013-07-28 DIAGNOSIS — Z930 Tracheostomy status: Secondary | ICD-10-CM | POA: Diagnosis not present

## 2013-07-28 DIAGNOSIS — G7109 Other specified muscular dystrophies: Secondary | ICD-10-CM | POA: Diagnosis not present

## 2013-09-13 ENCOUNTER — Telehealth (INDEPENDENT_AMBULATORY_CARE_PROVIDER_SITE_OTHER): Payer: Medicare Other

## 2013-09-13 DIAGNOSIS — R301 Vesical tenesmus: Secondary | ICD-10-CM

## 2013-09-13 DIAGNOSIS — R3989 Other symptoms and signs involving the genitourinary system: Secondary | ICD-10-CM

## 2013-09-13 MED ORDER — PHENAZOPYRIDINE HCL 100 MG PO TABS: 100.0000 mg | ORAL_TABLET | Freq: Three times a day (TID) | ORAL | Status: DC | PRN

## 2013-09-13 NOTE — Telephone Encounter (Signed)
Caregiver advised.  They will get a urine specimen to bring in to the office ASAP.

## 2013-09-13 NOTE — Telephone Encounter (Signed)
plz notify pyridium sent in. I'd like to obtain UA as well with option to culture if abnormal.

## 2013-09-13 NOTE — Telephone Encounter (Signed)
Horris Latino with [facility] has [facility] home health nurse on phone; pt has muscular dystrophy and ventolator dependent. Pt has suprapubic cath; pt output on 09/11/13 was 700 cc of amber colored urine. Today pt has output of 900 cc of urine but pt feeling a lot of pressure and spasms in lower abdomen. Minimal pain mostly pressure feeling. [facility] request med for bladder spasm to [facility]. Pt is home visit pt.Please advise.

## 2013-09-14 DIAGNOSIS — R3989 Other symptoms and signs involving the genitourinary system: Secondary | ICD-10-CM | POA: Diagnosis not present

## 2013-09-14 LAB — POCT URINALYSIS DIPSTICK
Bilirubin, UA: NEGATIVE
Glucose, UA: NEGATIVE
Ketones, UA: NEGATIVE
Nitrite, UA: POSITIVE
Protein, UA: NEGATIVE
UROBILINOGEN UA: 0.2
pH, UA: 8

## 2013-09-14 NOTE — Telephone Encounter (Signed)
Urine culture ordered

## 2013-09-14 NOTE — Telephone Encounter (Signed)
Urine sample brought in. Results in chart. Drawn for UCx if desired.

## 2013-09-14 NOTE — Addendum Note (Signed)
Addended by: Royann Shivers A on: 09/14/2013 04:08 PM   Modules accepted: Orders

## 2013-09-14 NOTE — Addendum Note (Signed)
Addended by: Ria Bush on: 09/14/2013 05:05 PM   Modules accepted: Orders

## 2013-09-16 LAB — URINE CULTURE: Colony Count: >=100000

## 2013-09-22 ENCOUNTER — Encounter: Payer: Self-pay | Admitting: Internal Medicine

## 2013-09-22 ENCOUNTER — Ambulatory Visit: Payer: Medicare Other | Admitting: Internal Medicine

## 2013-09-22 VITALS — BP 102/74 | HR 78 | Resp 14

## 2013-09-22 DIAGNOSIS — Z9911 Dependence on respirator [ventilator] status: Secondary | ICD-10-CM

## 2013-09-22 DIAGNOSIS — R532 Functional quadriplegia: Secondary | ICD-10-CM | POA: Diagnosis not present

## 2013-09-22 DIAGNOSIS — G7109 Other specified muscular dystrophies: Secondary | ICD-10-CM | POA: Diagnosis not present

## 2013-09-22 DIAGNOSIS — G7101 Duchenne or Becker muscular dystrophy: Secondary | ICD-10-CM

## 2013-09-22 DIAGNOSIS — K3184 Gastroparesis: Secondary | ICD-10-CM

## 2013-09-22 DIAGNOSIS — R339 Retention of urine, unspecified: Secondary | ICD-10-CM

## 2013-09-22 DIAGNOSIS — I43 Cardiomyopathy in diseases classified elsewhere: Secondary | ICD-10-CM

## 2013-09-22 DIAGNOSIS — I429 Cardiomyopathy, unspecified: Secondary | ICD-10-CM

## 2013-09-22 DIAGNOSIS — G589 Mononeuropathy, unspecified: Secondary | ICD-10-CM

## 2013-09-22 MED ORDER — LINACLOTIDE 145 MCG PO CAPS: 145.0000 ug | ORAL_CAPSULE | Freq: Every day | ORAL | Status: DC

## 2013-09-22 NOTE — Assessment & Plan Note (Signed)
Now affecting all visceral muscle as well---bowel, bladder, cardiac, etc

## 2013-09-22 NOTE — Assessment & Plan Note (Signed)
Not tachycardic today!

## 2013-09-22 NOTE — Assessment & Plan Note (Signed)
Doing okay with the tramadol

## 2013-09-22 NOTE — Assessment & Plan Note (Signed)
Doing well on current settings No changes needed Will be seeing ENT for reeval of the trach

## 2013-09-22 NOTE — Assessment & Plan Note (Signed)
Some pain but tylenol or motrin are helpful

## 2013-09-22 NOTE — Progress Notes (Signed)
Subjective:    Patient ID: [name], male    DOB: 04/11/1980, 33 y.o.   MRN: [id number]  HPI RN is here  Having more bladder spasms again Pyridium helps Reviewed culture which just showed multiple organisms and was inconclusive Usually still changing monthly Lots of sediment---getting irrigated daily  Feels that the linzess seemed to help at first Not as clearly helpful recently Still just taking daily--wonders about increasing No apparent side effects---wonders about trying increased dose  Still up in chair only once a day--for about 5 hours Lackluster appetite--tries to get 2 meals in while up in chair Can have late snack in bed like cheese or peanut butter cracker---swallows these okay  Breathing is okay on current vent settings Saw pulmonary a couple of months ago--no changes but referred to ENT for reevaluation of trach  Current Outpatient Prescriptions on File Prior to Visit  Medication Sig Dispense Refill  . acetaminophen (TYLENOL) 500 MG tablet Take 500 mg by mouth every 6 (six) hours as needed.        . ALPRAZolam (XANAX XR) 0.5 MG 24 hr tablet Take 0.5 mg by mouth daily as needed for anxiety or sleep.      Marland Kitchen aspirin-acetaminophen-caffeine (EXCEDRIN MIGRAINE) 250-250-65 MG per tablet Take 1 tablet by mouth every 6 (six) hours as needed for pain.      . eszopiclone (LUNESTA) 1 MG TABS tablet TAKE 1 TABLET BY MOUTH AT BEDTIME AS NEEDED (TAKE IMMEDIATELY BEFORE BEDTIME)  30 tablet  3  . ibuprofen (ADVIL,MOTRIN) 200 MG tablet Take 600 mg by mouth 3 (three) times daily as needed.       . Multiple Vitamins-Minerals (ONE-A-DAY VITACRAVES IMMUNITY) CHEW Chew 1 tablet by mouth daily.      . phenazopyridine (PYRIDIUM) 100 MG tablet Take 1 tablet (100 mg total) by mouth 3 (three) times daily as needed (bladder spasm).  30 tablet  0  . polyethylene glycol (MIRALAX / GLYCOLAX) packet Take 17 g by mouth daily as needed.       . promethazine (PHENERGAN) 12.5 MG tablet Take 1  tablet (12.5 mg total) by mouth 3 (three) times daily as needed for nausea.  20 tablet  2  . simethicone (MYLICON) 80 MG chewable tablet Chew 320 mg by mouth 4 (four) times daily as needed.       . traMADol (ULTRAM) 50 MG tablet Take 1-2 tablets (50-100 mg total) by mouth at bedtime.  60 tablet  5   No current facility-administered medications on file prior to visit.    Allergies  Allergen Reactions  . Cephalexin     REACTION: GI distress  . Penicillins     REACTION: rash only  . Pregabalin     REACTION: urinary retention    Past Medical History  Diagnosis Date  . Duchenne muscular dystrophy     with associated cardiomyopathy  . IBS (irritable bowel syndrome)   . Neuropathy     Past Surgical History  Procedure Laterality Date  . Pneumonia  6/02    Resp failure--J tube for a while then removed  . Scoliosis repair  ~1991    Thoracic--with Harrington rods  . Abdominal distention  4/05    Laparotomy negative  . Pneumonia  11/06  . Suprapubic catheter insertion  5/14    [facility]  . Suprapubic catheter insertion  5/14    Family History  Problem Relation Age of Onset  . Diabetes Neg Hx   . Heart disease Neg Hx   .  Hypertension Neg Hx   . Cancer Neg Hx     History   Social History  . Marital Status: Single    Spouse Name: N/A    Number of Children: 0  . Years of Education: N/A   Occupational History  . Diabled    Social History Main Topics  . Smoking status: Never Smoker   . Smokeless tobacco: Never Used  . Alcohol Use: No  . Drug Use: No  . Sexual Activity:    Other Topics Concern  . Not on file   Social History Narrative       Review of Systems Sleeps reasonably well--uses tramadol and rarely the xanax Mild leg pain--gets some relief with tylenol or motrin    Objective:   Physical Exam  Constitutional: No distress.  alert  Neck: No thyromegaly present.  Cardiovascular: Normal rate, regular rhythm and normal heart sounds.  Exam reveals no gallop.     No murmur heard. Subxiphoid heart sounds  Pulmonary/Chest: Effort normal and breath sounds normal. No respiratory distress. He has no wheezes. He has no rales.  Abdominal: Soft. There is no tenderness.  Musculoskeletal: He exhibits no edema.  Contracture in knees-- left > right  Lymphadenopathy:    He has no cervical adenopathy.  Neurological:  Hypotonic   Psychiatric: He has a normal mood and affect. His behavior is normal.          Assessment & Plan:

## 2013-09-22 NOTE — Assessment & Plan Note (Signed)
Wheelchair and bed bound Total care

## 2013-09-22 NOTE — Assessment & Plan Note (Signed)
Satisfied with the suprapubic catheter Some spasm that pyridium has really helped

## 2013-09-22 NOTE — Assessment & Plan Note (Signed)
Chronic abdominal symptoms may be some better with linzess Will try increasing to 2 daily to see if he gets more response

## 2013-10-04 DIAGNOSIS — G894 Chronic pain syndrome: Secondary | ICD-10-CM | POA: Diagnosis not present

## 2013-10-04 DIAGNOSIS — G7109 Other specified muscular dystrophies: Secondary | ICD-10-CM | POA: Diagnosis not present

## 2013-10-05 ENCOUNTER — Other Ambulatory Visit: Payer: Self-pay | Admitting: Internal Medicine

## 2013-10-05 NOTE — Telephone Encounter (Signed)
04/07/13 

## 2013-10-05 NOTE — Telephone Encounter (Signed)
rx called into pharmacy

## 2013-10-05 NOTE — Telephone Encounter (Signed)
Okay #60 x 5 Administered by RN in home

## 2013-11-04 DIAGNOSIS — G7109 Other specified muscular dystrophies: Secondary | ICD-10-CM | POA: Diagnosis not present

## 2013-11-04 DIAGNOSIS — Z930 Tracheostomy status: Secondary | ICD-10-CM | POA: Diagnosis not present

## 2013-11-04 DIAGNOSIS — R338 Other retention of urine: Secondary | ICD-10-CM | POA: Diagnosis not present

## 2013-11-12 DIAGNOSIS — Z930 Tracheostomy status: Secondary | ICD-10-CM

## 2013-11-12 DIAGNOSIS — G7109 Other specified muscular dystrophies: Secondary | ICD-10-CM | POA: Diagnosis not present

## 2013-11-12 DIAGNOSIS — R339 Retention of urine, unspecified: Secondary | ICD-10-CM | POA: Diagnosis not present

## 2013-11-24 ENCOUNTER — Ambulatory Visit: Payer: Medicare Other | Admitting: Internal Medicine

## 2013-11-24 ENCOUNTER — Encounter: Payer: Self-pay | Admitting: Internal Medicine

## 2013-11-24 VITALS — BP 90/46 | HR 90 | Temp 97.4°F | Resp 14

## 2013-11-24 DIAGNOSIS — K3184 Gastroparesis: Secondary | ICD-10-CM

## 2013-11-24 DIAGNOSIS — R532 Functional quadriplegia: Secondary | ICD-10-CM

## 2013-11-24 DIAGNOSIS — M24573 Contracture, unspecified ankle: Secondary | ICD-10-CM

## 2013-11-24 DIAGNOSIS — Z9911 Dependence on respirator [ventilator] status: Secondary | ICD-10-CM

## 2013-11-24 DIAGNOSIS — Z23 Encounter for immunization: Secondary | ICD-10-CM | POA: Diagnosis not present

## 2013-11-24 DIAGNOSIS — I429 Cardiomyopathy, unspecified: Secondary | ICD-10-CM

## 2013-11-24 DIAGNOSIS — M24576 Contracture, unspecified foot: Secondary | ICD-10-CM

## 2013-11-24 DIAGNOSIS — G7101 Duchenne or Becker muscular dystrophy: Secondary | ICD-10-CM

## 2013-11-24 DIAGNOSIS — G7109 Other specified muscular dystrophies: Secondary | ICD-10-CM

## 2013-11-24 DIAGNOSIS — I43 Cardiomyopathy in diseases classified elsewhere: Secondary | ICD-10-CM

## 2013-11-24 DIAGNOSIS — G589 Mononeuropathy, unspecified: Secondary | ICD-10-CM

## 2013-11-24 NOTE — Addendum Note (Signed)
Addended by: Despina Hidden on: 11/24/2013 04:03 PM   Modules accepted: Orders, Medications

## 2013-11-24 NOTE — Assessment & Plan Note (Signed)
Gets some relief from the tramadol

## 2013-11-24 NOTE — Assessment & Plan Note (Signed)
Severe with multisystem involvement Vent dependent Total care

## 2013-11-24 NOTE — Assessment & Plan Note (Signed)
Cannot move except head Uses Dragon to control computer

## 2013-11-24 NOTE — Assessment & Plan Note (Signed)
Stable on current settings Still due to see ENT just to do routine check on the trach RNs change trach once a month

## 2013-11-24 NOTE — Assessment & Plan Note (Signed)
Has not had the tachycardia since vent rate increased

## 2013-11-24 NOTE — Assessment & Plan Note (Signed)
Multiple contractures throughout but no progression No joint pains

## 2013-11-24 NOTE — Assessment & Plan Note (Signed)
Definitely better with the linzess at higher dose Bowel regimen stable Still gets pain--simethicone helps

## 2013-11-24 NOTE — Progress Notes (Signed)
Subjective:    Patient ID: [name], male    DOB: 26-Feb-1981, 33 y.o.   MRN: [id number]  HPI [name] his RN is here  Doing okay Has changed to OTC uristat for his bladder pain Now with silicone catheter---doesn't seem to be clogging as much Still gets flushed, but usually only once a day--and hasn't needed the acetic acid Catheter change once a month Now happy with suprapubic cath---pain is basically gone  Did increase the linzess to 2 daily Not having the abdominal discomfort during the day now Still has trouble at night Still gets some relief from simethicone Stopped the phenergan--made him feel funny  No problems with vent No dyspnea Suctioned 3-4 times per day, and 6-7 times at night Has a fair amount of secretions---depending on the ambient humidity, etc No illness-- like fever or cough  Still up ~5 hours in chair 2 meals during that time Bedtime snack when back in bed---like peanut butter nabs Doesn't seem to have any weight loss  No recent tachycardia No palpitations or chest pain  Current Outpatient Prescriptions on File Prior to Visit  Medication Sig Dispense Refill  . acetaminophen (TYLENOL) 500 MG tablet Take 500 mg by mouth every 6 (six) hours as needed.        . ALPRAZolam (XANAX XR) 0.5 MG 24 hr tablet Take 0.5 mg by mouth daily as needed for anxiety or sleep.      . eszopiclone (LUNESTA) 1 MG TABS tablet TAKE 1 TABLET BY MOUTH AT BEDTIME AS NEEDED (TAKE IMMEDIATELY BEFORE BEDTIME)  30 tablet  3  . ibuprofen (ADVIL,MOTRIN) 200 MG tablet Take 600 mg by mouth 3 (three) times daily as needed.       . Multiple Vitamins-Minerals (ONE-A-DAY VITACRAVES IMMUNITY) CHEW Chew 1 tablet by mouth daily.      . phenazopyridine (PYRIDIUM) 100 MG tablet Take 1 tablet (100 mg total) by mouth 3 (three) times daily as needed (bladder spasm).  30 tablet  0  . polyethylene glycol (MIRALAX / GLYCOLAX) packet Take 17 g by mouth daily.       . simethicone (MYLICON) 80 MG  chewable tablet Chew 320 mg by mouth 4 (four) times daily as needed.        No current facility-administered medications on file prior to visit.    Allergies  Allergen Reactions  . Cephalexin     REACTION: GI distress  . Penicillins     REACTION: rash only  . Pregabalin     REACTION: urinary retention    Past Medical History  Diagnosis Date  . Duchenne muscular dystrophy     with associated cardiomyopathy  . IBS (irritable bowel syndrome)   . Neuropathy     Past Surgical History  Procedure Laterality Date  . Pneumonia  6/02    Resp failure--J tube for a while then removed  . Scoliosis repair  ~1991    Thoracic--with Harrington rods  . Abdominal distention  4/05    Laparotomy negative  . Pneumonia  11/06  . Suprapubic catheter insertion  5/14    [facility]  . Suprapubic catheter insertion  5/14    Family History  Problem Relation Age of Onset  . Diabetes Neg Hx   . Heart disease Neg Hx   . Hypertension Neg Hx   . Cancer Neg Hx     History   Social History  . Marital Status: Single    Spouse Name: N/A    Number of Children: 0  .  Years of Education: N/A   Occupational History  . Diabled    Social History Main Topics  . Smoking status: Never Smoker   . Smokeless tobacco: Never Used  . Alcohol Use: No  . Drug Use: No  . Sexual Activity:    Other Topics Concern  . Not on file   Social History Narrative       Review of Systems No ulcers or red skin No major joint pains Does take the excedrin regularly 1-2 caffeine sodas per day    Objective:   Physical Exam  Constitutional: No distress.  Up in wheelchair  Neck: No thyromegaly present.  Trach site fine  Cardiovascular: Normal rate, regular rhythm and normal heart sounds.  Exam reveals no gallop.   No murmur heard. Pulmonary/Chest: Effort normal and breath sounds normal. No respiratory distress. He has no wheezes. He has no rales.  Abdominal: Soft. There is no tenderness.  Musculoskeletal: He  exhibits no edema.  Contractures in all 4 extremities  Lymphadenopathy:    He has no cervical adenopathy.  Neurological:  Decreased tone Very little muscle mass  Skin: No rash noted.  Psychiatric: He has a normal mood and affect. His behavior is normal.          Assessment & Plan:

## 2013-12-29 DIAGNOSIS — G894 Chronic pain syndrome: Secondary | ICD-10-CM | POA: Diagnosis not present

## 2013-12-30 DIAGNOSIS — G710 Muscular dystrophy: Secondary | ICD-10-CM | POA: Diagnosis not present

## 2014-01-07 DIAGNOSIS — R338 Other retention of urine: Secondary | ICD-10-CM | POA: Diagnosis not present

## 2014-01-07 DIAGNOSIS — Z930 Tracheostomy status: Secondary | ICD-10-CM | POA: Diagnosis not present

## 2014-01-07 DIAGNOSIS — G710 Muscular dystrophy: Secondary | ICD-10-CM | POA: Diagnosis not present

## 2014-01-12 DIAGNOSIS — G5791 Unspecified mononeuropathy of right lower limb: Secondary | ICD-10-CM | POA: Diagnosis not present

## 2014-01-12 DIAGNOSIS — G894 Chronic pain syndrome: Secondary | ICD-10-CM | POA: Diagnosis not present

## 2014-01-13 ENCOUNTER — Telehealth: Payer: Self-pay

## 2014-01-13 NOTE — Telephone Encounter (Signed)
That is fine 

## 2014-01-13 NOTE — Telephone Encounter (Signed)
[name] with [employer] left v/m requesting verbal order to replace oxygen concentrator; pts father has elected to get new equipment; after verbal order received [employer] will fax CMN next week.

## 2014-01-14 NOTE — Telephone Encounter (Signed)
Spoke with Nevin Bloodgood from [place] to give Verbal order for new concentrator. Nevin Bloodgood stated that the CMN should get here in about a week or so.

## 2014-01-17 DIAGNOSIS — G710 Muscular dystrophy: Secondary | ICD-10-CM | POA: Diagnosis not present

## 2014-02-09 ENCOUNTER — Ambulatory Visit: Payer: Medicare Other | Admitting: Internal Medicine

## 2014-02-09 ENCOUNTER — Encounter: Payer: Self-pay | Admitting: Internal Medicine

## 2014-02-09 VITALS — BP 90/52 | HR 114 | Temp 96.9°F | Resp 14

## 2014-02-09 DIAGNOSIS — G7101 Duchenne or Becker muscular dystrophy: Secondary | ICD-10-CM

## 2014-02-09 DIAGNOSIS — Z9911 Dependence on respirator [ventilator] status: Secondary | ICD-10-CM | POA: Diagnosis not present

## 2014-02-09 DIAGNOSIS — R532 Functional quadriplegia: Secondary | ICD-10-CM | POA: Diagnosis not present

## 2014-02-09 DIAGNOSIS — G710 Muscular dystrophy: Secondary | ICD-10-CM | POA: Diagnosis not present

## 2014-02-09 DIAGNOSIS — K3184 Gastroparesis: Secondary | ICD-10-CM | POA: Diagnosis not present

## 2014-02-09 DIAGNOSIS — M24573 Contracture, unspecified ankle: Secondary | ICD-10-CM

## 2014-02-09 DIAGNOSIS — M24576 Contracture, unspecified foot: Secondary | ICD-10-CM | POA: Diagnosis not present

## 2014-02-09 DIAGNOSIS — I429 Cardiomyopathy, unspecified: Secondary | ICD-10-CM | POA: Diagnosis not present

## 2014-02-09 DIAGNOSIS — I43 Cardiomyopathy in diseases classified elsewhere: Secondary | ICD-10-CM

## 2014-02-09 DIAGNOSIS — R339 Retention of urine, unspecified: Secondary | ICD-10-CM | POA: Diagnosis not present

## 2014-02-09 NOTE — Assessment & Plan Note (Signed)
Better with the linzess Still some cramping at night

## 2014-02-09 NOTE — Assessment & Plan Note (Signed)
In bed most of day Up to wheelchair ~5 hours per day

## 2014-02-09 NOTE — Progress Notes (Signed)
Subjective:    Patient ID: [name], male    DOB: 01/21/1981, 33 y.o.   MRN: [id number]  HPI [name] is here  Doing okay Recent visit to pain clinic--- allowing him a 3rd tramadol at night if needed Got injection in right foot and that really helped (scattered 5 injections in soft tissue around lateral malleolus) Unclear if pain is neuropathy, from contractures, or both  Ongoing GI problems linzess does help in day--but does have some cramping pain at night still Overall still better Bowels are okay using miralax very 2-3 days  Some increased secretions ?related to the weather Suctioning doesn't always clear him---even doing it 10-12 times in a 24 hour period Mostly clear--some off white. Rare blood flecks Still satisfied with vent settings---no sense of SOB  Suprapubic catheter still doing well Silicone catheter and regular pyridium (and trying to increase fluids) has reduced problems Catheter change only about monthly without excessive debris  Appetite is okay Eats 2 meals while up in wheelchair-- snack at night in bed May have gained a little  Recent cardiology check up No echo this year  Current Outpatient Prescriptions on File Prior to Visit  Medication Sig Dispense Refill  . acetaminophen (TYLENOL) 500 MG tablet Take 500 mg by mouth every 6 (six) hours as needed.      . ALPRAZolam (XANAX XR) 0.5 MG 24 hr tablet Take 0.5 mg by mouth daily as needed for anxiety or sleep.    Marland Kitchen aspirin-acetaminophen-caffeine (EXCEDRIN MIGRAINE) 250-250-65 MG per tablet Take 2 tablets by mouth daily.    . eszopiclone (LUNESTA) 1 MG TABS tablet TAKE 1 TABLET BY MOUTH AT BEDTIME AS NEEDED (TAKE IMMEDIATELY BEFORE BEDTIME) 30 tablet 3  . ibuprofen (ADVIL,MOTRIN) 200 MG tablet Take 600 mg by mouth 3 (three) times daily as needed.     . Linaclotide (LINZESS) 145 MCG CAPS capsule Take 290 mcg by mouth daily.    . Multiple Vitamins-Minerals (ONE-A-DAY VITACRAVES IMMUNITY) CHEW Chew 1  tablet by mouth daily.    . phenazopyridine (PYRIDIUM) 100 MG tablet Take 1 tablet (100 mg total) by mouth 3 (three) times daily as needed (bladder spasm). 30 tablet 0  . polyethylene glycol (MIRALAX / GLYCOLAX) packet Take 17 g by mouth every other day.     . simethicone (MYLICON) 80 MG chewable tablet Chew 320 mg by mouth 4 (four) times daily as needed.     . traMADol (ULTRAM) 50 MG tablet 2 tablets at bedtime. take 1 additional 4 hours later if needed     No current facility-administered medications on file prior to visit.    Allergies  Allergen Reactions  . Cephalexin     REACTION: GI distress  . Penicillins     REACTION: rash only  . Pregabalin     REACTION: urinary retention    Past Medical History  Diagnosis Date  . Duchenne muscular dystrophy     with associated cardiomyopathy  . IBS (irritable bowel syndrome)   . Neuropathy     Past Surgical History  Procedure Laterality Date  . Pneumonia  6/02    Resp failure--J tube for a while then removed  . Scoliosis repair  ~1991    Thoracic--with Harrington rods  . Abdominal distention  4/05    Laparotomy negative  . Pneumonia  11/06  . Suprapubic catheter insertion  5/14    [facility]  . Suprapubic catheter insertion  5/14    Family History  Problem Relation Age of Onset  .  Diabetes Neg Hx   . Heart disease Neg Hx   . Hypertension Neg Hx   . Cancer Neg Hx     History   Social History  . Marital Status: Single    Spouse Name: N/A    Number of Children: 0  . Years of Education: N/A   Occupational History  . Diabled    Social History Main Topics  . Smoking status: Never Smoker   . Smokeless tobacco: Never Used  . Alcohol Use: No  . Drug Use: No  . Sexual Activity: Not on file   Other Topics Concern  . Not on file   Social History Narrative       Review of Systems  Sleep is okay in general with his regimen Continues to use the computer--likes to shop    Objective:   Physical Exam  Constitutional: No  distress.  Comfortable in motorized wheelchair  Neck: Normal range of motion. Neck supple. No thyromegaly present.  Cardiovascular: Regular rhythm and normal heart sounds.  Exam reveals no gallop.   No murmur heard. Tachycardic--not new for him  Pulmonary/Chest: Effort normal and breath sounds normal. No respiratory distress. He has no wheezes. He has no rales.  Abdominal: Soft. There is no tenderness.  Musculoskeletal:  Contractures of hands and feet  Lymphadenopathy:    He has no cervical adenopathy.  Psychiatric: He has a normal mood and affect. His behavior is normal.          Assessment & Plan:

## 2014-02-09 NOTE — Assessment & Plan Note (Signed)
Baseline tachycardia No CHF

## 2014-02-09 NOTE — Assessment & Plan Note (Signed)
Happy with the suprapubic catheter Pyridium has decreased the spasm and pain

## 2014-02-09 NOTE — Assessment & Plan Note (Signed)
Relatively stable Totally dependent 24/7 nursing care

## 2014-02-09 NOTE — Assessment & Plan Note (Signed)
Pain clearly better after steroid injection at pain clinic Continues on tramadol

## 2014-02-09 NOTE — Assessment & Plan Note (Signed)
Satisfied with current settings Could still increase rate if he had dyspnea--- but seems to have acceptable minute volume with this rate and TV

## 2014-02-23 DIAGNOSIS — R1084 Generalized abdominal pain: Secondary | ICD-10-CM | POA: Diagnosis not present

## 2014-02-23 DIAGNOSIS — G5791 Unspecified mononeuropathy of right lower limb: Secondary | ICD-10-CM | POA: Diagnosis not present

## 2014-02-23 DIAGNOSIS — G894 Chronic pain syndrome: Secondary | ICD-10-CM | POA: Diagnosis not present

## 2014-03-14 DIAGNOSIS — Z430 Encounter for attention to tracheostomy: Secondary | ICD-10-CM | POA: Diagnosis not present

## 2014-03-14 DIAGNOSIS — Z930 Tracheostomy status: Secondary | ICD-10-CM | POA: Diagnosis not present

## 2014-03-14 DIAGNOSIS — Z435 Encounter for attention to cystostomy: Secondary | ICD-10-CM | POA: Diagnosis not present

## 2014-03-14 DIAGNOSIS — R338 Other retention of urine: Secondary | ICD-10-CM | POA: Diagnosis not present

## 2014-03-14 DIAGNOSIS — G710 Muscular dystrophy: Secondary | ICD-10-CM | POA: Diagnosis not present

## 2014-03-15 DIAGNOSIS — R05 Cough: Secondary | ICD-10-CM | POA: Diagnosis not present

## 2014-03-15 DIAGNOSIS — J9612 Chronic respiratory failure with hypercapnia: Secondary | ICD-10-CM | POA: Diagnosis not present

## 2014-03-15 DIAGNOSIS — Z930 Tracheostomy status: Secondary | ICD-10-CM | POA: Diagnosis not present

## 2014-04-13 ENCOUNTER — Telehealth: Payer: Self-pay

## 2014-04-13 NOTE — Telephone Encounter (Signed)
[place] home care nurse left v/m that medicaid denied the alprazolam XR and [name] request alprazolam 0.[facility] mg to [facility]. Pt having some anxiety issues.Please advise. Pt last seen 02/09/14.

## 2014-04-14 MED ORDER — ALPRAZOLAM 0.5 MG PO TABS: 0.5000 mg | ORAL_TABLET | Freq: Three times a day (TID) | ORAL | Status: DC | PRN

## 2014-04-14 NOTE — Telephone Encounter (Signed)
Okay to change the prescription to alprazolam 0.$RemoveBeforeD'5mg'AcGIgAXzsoHWEl$  tid prn #90 x 0

## 2014-04-14 NOTE — Telephone Encounter (Signed)
rx called into pharmacy

## 2014-04-18 ENCOUNTER — Telehealth: Payer: Self-pay | Admitting: *Deleted

## 2014-04-18 NOTE — Telephone Encounter (Signed)
Please see forms on your desk asking for CMN for oxygen.

## 2014-04-19 NOTE — Telephone Encounter (Signed)
Form done No charge 

## 2014-04-19 NOTE — Telephone Encounter (Signed)
Form faxed to [facility].

## 2014-04-27 DIAGNOSIS — G710 Muscular dystrophy: Secondary | ICD-10-CM | POA: Diagnosis not present

## 2014-04-27 DIAGNOSIS — R140 Abdominal distension (gaseous): Secondary | ICD-10-CM | POA: Diagnosis not present

## 2014-04-27 DIAGNOSIS — R1030 Lower abdominal pain, unspecified: Secondary | ICD-10-CM | POA: Diagnosis not present

## 2014-04-27 DIAGNOSIS — K589 Irritable bowel syndrome without diarrhea: Secondary | ICD-10-CM | POA: Diagnosis not present

## 2014-05-04 ENCOUNTER — Encounter: Payer: Self-pay | Admitting: Internal Medicine

## 2014-05-04 ENCOUNTER — Ambulatory Visit: Payer: Medicare Other | Admitting: Internal Medicine

## 2014-05-04 VITALS — BP 90/54 | HR 112 | Temp 97.7°F | Resp 16

## 2014-05-04 DIAGNOSIS — R532 Functional quadriplegia: Secondary | ICD-10-CM

## 2014-05-04 DIAGNOSIS — M24573 Contracture, unspecified ankle: Secondary | ICD-10-CM

## 2014-05-04 DIAGNOSIS — K3184 Gastroparesis: Secondary | ICD-10-CM | POA: Diagnosis not present

## 2014-05-04 DIAGNOSIS — M24576 Contracture, unspecified foot: Secondary | ICD-10-CM

## 2014-05-04 DIAGNOSIS — Z9911 Dependence on respirator [ventilator] status: Secondary | ICD-10-CM

## 2014-05-04 DIAGNOSIS — R339 Retention of urine, unspecified: Secondary | ICD-10-CM | POA: Diagnosis not present

## 2014-05-04 DIAGNOSIS — F39 Unspecified mood [affective] disorder: Secondary | ICD-10-CM | POA: Diagnosis not present

## 2014-05-04 DIAGNOSIS — I43 Cardiomyopathy in diseases classified elsewhere: Secondary | ICD-10-CM

## 2014-05-04 DIAGNOSIS — I429 Cardiomyopathy, unspecified: Secondary | ICD-10-CM

## 2014-05-04 NOTE — Assessment & Plan Note (Signed)
No pain

## 2014-05-04 NOTE — Assessment & Plan Note (Signed)
Baseline tachycardia but no symptoms

## 2014-05-04 NOTE — Assessment & Plan Note (Signed)
Continues to require total care

## 2014-05-04 NOTE — Assessment & Plan Note (Signed)
Comfortable with current vent settings Appears to have adequate tidal volume at these settings

## 2014-05-04 NOTE — Assessment & Plan Note (Signed)
Still happy with the suprapubic catheter He does get bladder pain when he is constipated though

## 2014-05-04 NOTE — Assessment & Plan Note (Signed)
Mostly related to chronic discomfort now Some anxiety that alprazolam helps---will switch to the short acting due to insurance

## 2014-05-04 NOTE — Assessment & Plan Note (Signed)
Ongoing difficulty On probiotic Will have trial of some antibiotic to see if that helps (I think the rifamixin would be better tolerated but likely not improved) Enemas are a good choice except they make him feel terrible!

## 2014-05-04 NOTE — Progress Notes (Signed)
Subjective:    Patient ID: [name], male    DOB: 08/12/1980, 34 y.o.   MRN: [id number]  HPI Follow up home visit Greg Flow RN is here  Ongoing abdominal issues Pain and bloating are persistent Some constipation---now had 5 days with no stool till enema yesterday Now he still feels bad from that--but did empty out Has always had worsened cramping, etc --right after enema  Had GI visit Concern about bacterial overgrowth---rifamixin ordered but not approved thus far by insurance Alternative is flagyl/cipro ---- not sure how well he will tolerate this  Appetite not really bad except the last 2 days due to cramps from the enema Better again today  Episodic anxiety still No longer able to get alprazolam ER I approved change to regular type--but pharmacy didn't fill for him Phoned now to confirm change and Rx from 1/16  Breathing is okay No dyspnea No excessive secretions Some blood tinged secretions briefly last week---held the aspirin for a couple of days  Some bladder pain---goes along with gas Still happy with suprapubic catheter Flushed twice a day and changed monthly  Current Outpatient Prescriptions on File Prior to Visit  Medication Sig Dispense Refill  . acetaminophen (TYLENOL) 500 MG tablet Take 500 mg by mouth every 6 (six) hours as needed.      . ALPRAZolam (XANAX) 0.5 MG tablet Take 1 tablet (0.5 mg total) by mouth 3 (three) times daily as needed. 90 tablet 0  . aspirin-acetaminophen-caffeine (EXCEDRIN MIGRAINE) 859-292-44 MG per tablet Take 2 tablets by mouth daily.    . eszopiclone (LUNESTA) 1 MG TABS tablet TAKE 1 TABLET BY MOUTH AT BEDTIME AS NEEDED (TAKE IMMEDIATELY BEFORE BEDTIME) 30 tablet 3  . ibuprofen (ADVIL,MOTRIN) 200 MG tablet Take 600 mg by mouth 3 (three) times daily as needed.     . Linaclotide (LINZESS) 145 MCG CAPS capsule Take 290 mcg by mouth daily.    . Multiple Vitamins-Minerals (ONE-A-DAY VITACRAVES IMMUNITY) CHEW Chew 1 tablet by mouth  daily.    . phenazopyridine (PYRIDIUM) 100 MG tablet Take 1 tablet (100 mg total) by mouth 3 (three) times daily as needed (bladder spasm). 30 tablet 0  . polyethylene glycol (MIRALAX / GLYCOLAX) packet Take 17 g by mouth every other day.     . simethicone (MYLICON) 80 MG chewable tablet Chew 320 mg by mouth 4 (four) times daily as needed.     . traMADol (ULTRAM) 50 MG tablet 2 tablets at bedtime. take 1 additional 4 hours later if needed     No current facility-administered medications on file prior to visit.    Allergies  Allergen Reactions  . Cephalexin     REACTION: GI distress  . Penicillins     REACTION: rash only  . Pregabalin     REACTION: urinary retention    Past Medical History  Diagnosis Date  . Duchenne muscular dystrophy     with associated cardiomyopathy  . IBS (irritable bowel syndrome)   . Neuropathy     Past Surgical History  Procedure Laterality Date  . Pneumonia  6/02    Resp failure--J tube for a while then removed  . Scoliosis repair  ~1991    Thoracic--with Harrington rods  . Abdominal distention  4/05    Laparotomy negative  . Pneumonia  11/06  . Suprapubic catheter insertion  5/14    [facility]  . Suprapubic catheter insertion  5/14    Family History  Problem Relation Age of Onset  . Diabetes Neg  Hx   . Heart disease Neg Hx   . Hypertension Neg Hx   . Cancer Neg Hx     History   Social History  . Marital Status: Single    Spouse Name: N/A    Number of Children: 0  . Years of Education: N/A   Occupational History  . Diabled    Social History Main Topics  . Smoking status: Never Smoker   . Smokeless tobacco: Never Used  . Alcohol Use: No  . Drug Use: No  . Sexual Activity: Not on file   Other Topics Concern  . Not on file   Social History Narrative       Review of Systems No fever Not a great sleeper--but no major change. Can be affected by the bloating at night No skin problems    Objective:   Physical Exam    Constitutional: No distress.  Neck: No thyromegaly present.  Cardiovascular: Regular rhythm, normal heart sounds and intact distal pulses.  Exam reveals no gallop.   No murmur heard. Rapid as usual  Pulmonary/Chest: Breath sounds normal. No respiratory distress. He has no wheezes. He has no rales.  Abdominal: Soft. He exhibits no distension. There is no tenderness.  Not distended now but slightly uncomfortable with deeper palpation  Lymphadenopathy:    He has no cervical adenopathy.  Neurological:  Markedly decreased tone throughout  Psychiatric: He has a normal mood and affect. His behavior is normal.          Assessment & Plan:

## 2014-06-07 ENCOUNTER — Telehealth: Payer: Self-pay

## 2014-06-07 NOTE — Telephone Encounter (Signed)
Valerie home care nurse left v/m;  Pt has new onset decubitus on coccyx. Valerie request cb about treatment plan. Valerie request cb and also cb to pts father at [phone].

## 2014-06-08 NOTE — Telephone Encounter (Signed)
If open, would try wet to dry dressing, change daily. If not open, try duoderm

## 2014-06-08 NOTE — Telephone Encounter (Signed)
Spoke to [place] and pt's father--Valerie is aware and stated it is a shallow open wound

## 2014-06-09 ENCOUNTER — Telehealth: Payer: Self-pay | Admitting: Internal Medicine

## 2014-06-09 NOTE — Telephone Encounter (Signed)
I have never seen this patient. Continue wet to dry for now

## 2014-06-09 NOTE — Telephone Encounter (Signed)
Elsa (Nurse) wanted to know if dressing style can be changed for pt from wet to dry to another type. Please call nurse at [phone]

## 2014-06-09 NOTE — Telephone Encounter (Signed)
Spoke to Harrison and she is aware as instructed

## 2014-06-14 DIAGNOSIS — G710 Muscular dystrophy: Secondary | ICD-10-CM | POA: Diagnosis not present

## 2014-06-14 DIAGNOSIS — Z430 Encounter for attention to tracheostomy: Secondary | ICD-10-CM

## 2014-06-14 DIAGNOSIS — R338 Other retention of urine: Secondary | ICD-10-CM | POA: Diagnosis not present

## 2014-06-14 DIAGNOSIS — Z930 Tracheostomy status: Secondary | ICD-10-CM | POA: Diagnosis not present

## 2014-06-14 DIAGNOSIS — Z435 Encounter for attention to cystostomy: Secondary | ICD-10-CM | POA: Diagnosis not present

## 2014-07-11 ENCOUNTER — Telehealth: Payer: Self-pay | Admitting: Internal Medicine

## 2014-07-11 NOTE — Telephone Encounter (Signed)
Pts father said that the home visit for Friday is ok.

## 2014-07-11 NOTE — Telephone Encounter (Signed)
Actually, the visit is planned for Wednesday. Discussed with his dad---this will be fine

## 2014-07-13 ENCOUNTER — Encounter: Payer: Self-pay | Admitting: Internal Medicine

## 2014-07-13 ENCOUNTER — Ambulatory Visit: Payer: Medicare Other | Admitting: Internal Medicine

## 2014-07-13 VITALS — BP 86/44 | HR 120 | Temp 97.7°F

## 2014-07-13 DIAGNOSIS — I43 Cardiomyopathy in diseases classified elsewhere: Secondary | ICD-10-CM

## 2014-07-13 DIAGNOSIS — Z9911 Dependence on respirator [ventilator] status: Secondary | ICD-10-CM

## 2014-07-13 DIAGNOSIS — L98429 Non-pressure chronic ulcer of back with unspecified severity: Secondary | ICD-10-CM | POA: Insufficient documentation

## 2014-07-13 DIAGNOSIS — R532 Functional quadriplegia: Secondary | ICD-10-CM | POA: Diagnosis not present

## 2014-07-13 DIAGNOSIS — M24576 Contracture, unspecified foot: Secondary | ICD-10-CM

## 2014-07-13 DIAGNOSIS — K3184 Gastroparesis: Secondary | ICD-10-CM

## 2014-07-13 DIAGNOSIS — L89152 Pressure ulcer of sacral region, stage 2: Secondary | ICD-10-CM

## 2014-07-13 DIAGNOSIS — M24573 Contracture, unspecified ankle: Secondary | ICD-10-CM | POA: Diagnosis not present

## 2014-07-13 DIAGNOSIS — I429 Cardiomyopathy, unspecified: Secondary | ICD-10-CM

## 2014-07-13 DIAGNOSIS — R339 Retention of urine, unspecified: Secondary | ICD-10-CM

## 2014-07-13 NOTE — Assessment & Plan Note (Signed)
Can't see without getting out of chair Greg Flow RN will text me picture and I will review treatment (just neosporin for now) Discussed that he might do better splitting his chair time into 2 segments--but this probably won't work for him

## 2014-07-13 NOTE — Assessment & Plan Note (Signed)
No tone in extremities but still with the contractures Doing okay with naproxen and tramadol for discomfort

## 2014-07-13 NOTE — Assessment & Plan Note (Signed)
Tachycardic as usual but no CHF

## 2014-07-13 NOTE — Assessment & Plan Note (Signed)
Bed chair bound [facility] Nurs[facility] 24/7

## 2014-07-13 NOTE — Assessment & Plan Note (Signed)
No solutions as yet Will retry the lactaid Going to special clinic to see if trach related to air swallowing

## 2014-07-13 NOTE — Assessment & Plan Note (Signed)
Seems to be doing fine on current settings Will be seeing trach/ENT clinic to see if larger trach might help abdominal distention (??leading to increased air swallowing??)

## 2014-07-13 NOTE — Progress Notes (Signed)
Subjective:    Patient ID: [name], male    DOB: 1980/11/23, 34 y.o.   MRN: [id number]  HPI [name] is here also  Did get approval for rifamixin and tried it--but couldn't tolerate it Tried probiotic but hasn't seemed to help Ongoing bloating and discomfort despite just about daily BMs Fairly consistent dairy eater--didn't see an improvement with lactaid  Has tried low FODMAP diet but hard to do this since most excluded foods are what he likes  Still once a day to power chair Eats 2 meals while up--then evening snack in bed Appetite is okay  Doing well with suprapubic catheter Needs small spot cauterized by tube  It is changed every month Voiding fine  Has stopped excedrin and ibuprofen Now taking 2 aleve when he gets up Uses tramadol at bedtime  Trach suctioned 2-3 times during day and 6-8 at night shift. Deep suction to about level of carina is done Does gets some of simethicone from that ??silent aspiration Concern about whether air could be coming up through the wide space around his trach now--with the abdominal distention No sense of SOB No cough  Still with superficial sacral wound Was nickel sized--now dime sized Didn't like the wet to dry so getting telfa with neosporin No inflammation Was exudative at the very beginning but no longer  Current Outpatient Prescriptions on File Prior to Visit  Medication Sig Dispense Refill  . acetaminophen (TYLENOL) 500 MG tablet Take 500 mg by mouth every 6 (six) hours as needed.      . ALPRAZolam (XANAX) 0.5 MG tablet Take 1 tablet (0.5 mg total) by mouth 3 (three) times daily as needed. 90 tablet 0  . Linaclotide (LINZESS) 145 MCG CAPS capsule Take 290 mcg by mouth daily.    . Multiple Vitamins-Minerals (ONE-A-DAY VITACRAVES IMMUNITY) CHEW Chew 1 tablet by mouth daily.    . phenazopyridine (PYRIDIUM) 100 MG tablet Take 1 tablet (100 mg total) by mouth 3 (three) times daily as needed (bladder spasm). 30 tablet 0  .  polyethylene glycol (MIRALAX / GLYCOLAX) packet Take 17 g by mouth daily as needed.     . simethicone (MYLICON) 80 MG chewable tablet Chew 320 mg by mouth 4 (four) times daily as needed.     . traMADol (ULTRAM) 50 MG tablet 2 tablets at bedtime. take 1 additional 4 hours later if needed     No current facility-administered medications on file prior to visit.    Allergies  Allergen Reactions  . Cephalexin     REACTION: GI distress  . Penicillins     REACTION: rash only  . Pregabalin     REACTION: urinary retention    Past Medical History  Diagnosis Date  . Duchenne muscular dystrophy     with associated cardiomyopathy  . IBS (irritable bowel syndrome)   . Neuropathy     Past Surgical History  Procedure Laterality Date  . Pneumonia  6/02    Resp failure--J tube for a while then removed  . Scoliosis repair  ~1991    Thoracic--with Harrington rods  . Abdominal distention  4/05    Laparotomy negative  . Pneumonia  11/06  . Suprapubic catheter insertion  5/14    [facility]  . Suprapubic catheter insertion  5/14    Family History  Problem Relation Age of Onset  . Diabetes Neg Hx   . Heart disease Neg Hx   . Hypertension Neg Hx   . Cancer Neg Hx  History   Social History  . Marital Status: Single    Spouse Name: N/A  . Number of Children: 0  . Years of Education: N/A   Occupational History  . Diabled    Social History Main Topics  . Smoking status: Never Smoker   . Smokeless tobacco: Never Used  . Alcohol Use: No  . Drug Use: No  . Sexual Activity: Not on file   Other Topics Concern  . Not on file   Social History Narrative       Review of Systems Weight seems stable Still not sleeping well---about the same as usual    Objective:   Physical Exam  Constitutional: No distress.  Up in chair  Neck: No thyromegaly present.  Trach site clean--does have a fair amount of extra space in trach site  Cardiovascular: Regular rhythm and normal heart sounds.   Exam reveals no gallop.   No murmur heard. Usual tachycardia  Pulmonary/Chest: Effort normal and breath sounds normal. No respiratory distress. He has no wheezes. He has no rales.  Abdominal:  Reddish tissue above suprapubic catheter--probably metaplasia  Musculoskeletal: He exhibits no edema or tenderness.  Lymphadenopathy:    He has no cervical adenopathy.  Psychiatric: He has a normal mood and affect. His behavior is normal.          Assessment & Plan:

## 2014-07-13 NOTE — Assessment & Plan Note (Signed)
Doing okay with suprapubic cath I will bring silver nitrate sticks next time to cauterize "proud flesh"

## 2014-07-19 DIAGNOSIS — Z930 Tracheostomy status: Secondary | ICD-10-CM | POA: Diagnosis not present

## 2014-07-19 DIAGNOSIS — Z435 Encounter for attention to cystostomy: Secondary | ICD-10-CM | POA: Diagnosis not present

## 2014-07-19 DIAGNOSIS — Z430 Encounter for attention to tracheostomy: Secondary | ICD-10-CM

## 2014-07-19 DIAGNOSIS — J9690 Respiratory failure, unspecified, unspecified whether with hypoxia or hypercapnia: Secondary | ICD-10-CM | POA: Diagnosis not present

## 2014-07-19 DIAGNOSIS — R338 Other retention of urine: Secondary | ICD-10-CM | POA: Diagnosis not present

## 2014-07-21 DIAGNOSIS — Z930 Tracheostomy status: Secondary | ICD-10-CM | POA: Diagnosis not present

## 2014-08-10 DIAGNOSIS — R52 Pain, unspecified: Secondary | ICD-10-CM | POA: Diagnosis not present

## 2014-08-10 DIAGNOSIS — M792 Neuralgia and neuritis, unspecified: Secondary | ICD-10-CM | POA: Diagnosis not present

## 2014-09-16 DIAGNOSIS — Z430 Encounter for attention to tracheostomy: Secondary | ICD-10-CM

## 2014-09-16 DIAGNOSIS — J9690 Respiratory failure, unspecified, unspecified whether with hypoxia or hypercapnia: Secondary | ICD-10-CM | POA: Diagnosis not present

## 2014-09-16 DIAGNOSIS — Z435 Encounter for attention to cystostomy: Secondary | ICD-10-CM | POA: Diagnosis not present

## 2014-09-16 DIAGNOSIS — R338 Other retention of urine: Secondary | ICD-10-CM | POA: Diagnosis not present

## 2014-09-16 DIAGNOSIS — Z930 Tracheostomy status: Secondary | ICD-10-CM | POA: Diagnosis not present

## 2014-09-21 ENCOUNTER — Ambulatory Visit: Payer: Medicare Other | Admitting: Internal Medicine

## 2014-09-21 ENCOUNTER — Encounter: Payer: Self-pay | Admitting: Internal Medicine

## 2014-09-21 VITALS — BP 90/60 | HR 96 | Temp 98.0°F | Resp 16

## 2014-09-21 DIAGNOSIS — K3184 Gastroparesis: Secondary | ICD-10-CM

## 2014-09-21 DIAGNOSIS — Z930 Tracheostomy status: Secondary | ICD-10-CM | POA: Diagnosis not present

## 2014-09-21 DIAGNOSIS — Z9359 Other cystostomy status: Secondary | ICD-10-CM

## 2014-09-21 DIAGNOSIS — I43 Cardiomyopathy in diseases classified elsewhere: Secondary | ICD-10-CM

## 2014-09-21 DIAGNOSIS — G710 Muscular dystrophy: Secondary | ICD-10-CM | POA: Diagnosis not present

## 2014-09-21 DIAGNOSIS — F39 Unspecified mood [affective] disorder: Secondary | ICD-10-CM

## 2014-09-21 DIAGNOSIS — Z9911 Dependence on respirator [ventilator] status: Secondary | ICD-10-CM | POA: Diagnosis not present

## 2014-09-21 DIAGNOSIS — R532 Functional quadriplegia: Secondary | ICD-10-CM

## 2014-09-21 DIAGNOSIS — I429 Cardiomyopathy, unspecified: Secondary | ICD-10-CM

## 2014-09-21 DIAGNOSIS — L929 Granulomatous disorder of the skin and subcutaneous tissue, unspecified: Secondary | ICD-10-CM

## 2014-09-21 DIAGNOSIS — G479 Sleep disorder, unspecified: Secondary | ICD-10-CM

## 2014-09-21 DIAGNOSIS — G7101 Duchenne or Becker muscular dystrophy: Secondary | ICD-10-CM

## 2014-09-21 NOTE — Assessment & Plan Note (Signed)
Satisfied with this overall

## 2014-09-21 NOTE — Progress Notes (Signed)
Subjective:    Patient ID: [name], male    DOB: 1980-11-16, 34 y.o.   MRN: [id number]  HPI Greg Flow RN here   Doing about the same Did get some tizanidine from pain clinic---didn't like it (made him groggy) Still uses aleve or excedrin migraine when he gets up--- alternates days Tramadol still helps at night  Ongoing GI problems Gassiness is about the same Bowels do move with his regimen  Still happy with suprapubic catheter Changed once a month Has the granuloma still  No sense of dyspnea Will occasionally get fatigued sensation--- may be more with humidity sats remain okay--- go up from 1liter to 2-3 Suctioning 8 times per 24 hours No change in secretions Not sick at all  Still up in powerchair once a day for 3-4 hours 2 meals while up Snack at night  No chest pain No palpitations  Current Outpatient Prescriptions on File Prior to Visit  Medication Sig Dispense Refill  . acetaminophen (TYLENOL) 500 MG tablet Take 500 mg by mouth every 6 (six) hours as needed.      . ALPRAZolam (XANAX) 0.5 MG tablet Take 1 tablet (0.5 mg total) by mouth 3 (three) times daily as needed. 90 tablet 0  . Linaclotide (LINZESS) 145 MCG CAPS capsule Take 290 mcg by mouth daily.    . Multiple Vitamins-Minerals (ONE-A-DAY VITACRAVES IMMUNITY) CHEW Chew 1 tablet by mouth daily.    . Naproxen Sodium 220 MG CAPS Take 440 mg by mouth daily.    . phenazopyridine (PYRIDIUM) 100 MG tablet Take 1 tablet (100 mg total) by mouth 3 (three) times daily as needed (bladder spasm). 30 tablet 0  . polyethylene glycol (MIRALAX / GLYCOLAX) packet Take 17 g by mouth daily as needed.     . simethicone (MYLICON) 80 MG chewable tablet Chew 320 mg by mouth 4 (four) times daily as needed.     . traMADol (ULTRAM) 50 MG tablet 2 tablets at bedtime. take 1 additional 4 hours later if needed     No current facility-administered medications on file prior to visit.    Allergies  Allergen Reactions  .  Cephalexin     REACTION: GI distress  . Penicillins     REACTION: rash only  . Pregabalin     REACTION: urinary retention    Past Medical History  Diagnosis Date  . Duchenne muscular dystrophy     with associated cardiomyopathy  . IBS (irritable bowel syndrome)   . Neuropathy     Past Surgical History  Procedure Laterality Date  . Pneumonia  6/02    Resp failure--J tube for a while then removed  . Scoliosis repair  ~1991    Thoracic--with Harrington rods  . Abdominal distention  4/05    Laparotomy negative  . Pneumonia  11/06  . Suprapubic catheter insertion  5/14    [facility]  . Suprapubic catheter insertion  5/14    Family History  Problem Relation Age of Onset  . Diabetes Neg Hx   . Heart disease Neg Hx   . Hypertension Neg Hx   . Cancer Neg Hx     History   Social History  . Marital Status: Single    Spouse Name: N/A  . Number of Children: 0  . Years of Education: N/A   Occupational History  . Diabled    Social History Main Topics  . Smoking status: Never Smoker   . Smokeless tobacco: Never Used  . Alcohol Use: No  .  Drug Use: No  . Sexual Activity: Not on file   Other Topics Concern  . Not on file   Social History Narrative       Review of Systems  Still not sleeping great---often won't fall asleep till 5AM and then awakens 2PM Appetite is okay Sacral wound did clear up      Objective:   Physical Exam  Constitutional: No distress.  Up in chair as usual  Neck: No thyromegaly present.  Trach site clean  Cardiovascular: Normal rate, regular rhythm and normal heart sounds.  Exam reveals no gallop.   No murmur heard. Pulmonary/Chest: Effort normal and breath sounds normal. No respiratory distress. He has no wheezes. He has no rales.  Abdominal: Soft. There is no tenderness.  Small granuloma to right of suprapubic catheter with some bleeding  Musculoskeletal: He exhibits no edema.  Lymphadenopathy:    He has no cervical adenopathy.    Neurological:  Very decreased tone Mod contractures still   Psychiatric: He has a normal mood and affect. His behavior is normal.          Assessment & Plan:

## 2014-09-21 NOTE — Assessment & Plan Note (Signed)
[facility] RN lifts to transfer

## 2014-09-21 NOTE — Assessment & Plan Note (Signed)
Getting a new ventilator soon  Pulmonary at [facility] arranging this

## 2014-09-21 NOTE — Assessment & Plan Note (Signed)
No problems with this 

## 2014-09-21 NOTE — Assessment & Plan Note (Signed)
HR down to 96 This is good for him

## 2014-09-21 NOTE — Assessment & Plan Note (Signed)
Mostly episodic anxiety when he can't sleep etc. Still gets some relief from the alprazolam

## 2014-09-21 NOTE — Assessment & Plan Note (Signed)
Sleeps 5AM to 2 PM Suggested he try to adapt to this rather than fight it

## 2014-09-21 NOTE — Assessment & Plan Note (Signed)
Very long lived but affected in all areas

## 2014-09-21 NOTE — Assessment & Plan Note (Signed)
At suprapubic catheter Some bleeding and discomfort Cauterized with silver nitrate effectively Discussed care with nurse

## 2014-09-21 NOTE — Assessment & Plan Note (Signed)
Ongoing gas and bowel symptoms Multiple meds tried and failed

## 2014-10-05 ENCOUNTER — Other Ambulatory Visit: Payer: Self-pay | Admitting: Internal Medicine

## 2014-10-05 NOTE — Telephone Encounter (Signed)
Ok to fill 

## 2014-10-06 NOTE — Telephone Encounter (Signed)
Approved: okay x 1 year 

## 2014-10-06 NOTE — Telephone Encounter (Signed)
rx sent to pharmacy by e-script  

## 2014-11-06 ENCOUNTER — Other Ambulatory Visit: Payer: Self-pay | Admitting: Internal Medicine

## 2014-11-07 NOTE — Telephone Encounter (Signed)
rx called into pharmacy

## 2014-11-07 NOTE — Telephone Encounter (Signed)
10/05/13 

## 2014-11-07 NOTE — Telephone Encounter (Signed)
Approved: okay #[id number] x 0 

## 2014-11-16 DIAGNOSIS — G5791 Unspecified mononeuropathy of right lower limb: Secondary | ICD-10-CM | POA: Diagnosis not present

## 2014-11-16 DIAGNOSIS — M792 Neuralgia and neuritis, unspecified: Secondary | ICD-10-CM | POA: Diagnosis not present

## 2014-11-16 DIAGNOSIS — R1084 Generalized abdominal pain: Secondary | ICD-10-CM | POA: Diagnosis not present

## 2014-11-16 DIAGNOSIS — G894 Chronic pain syndrome: Secondary | ICD-10-CM | POA: Diagnosis not present

## 2014-11-16 DIAGNOSIS — R1030 Lower abdominal pain, unspecified: Secondary | ICD-10-CM | POA: Diagnosis not present

## 2014-11-20 ENCOUNTER — Other Ambulatory Visit: Payer: Self-pay | Admitting: Internal Medicine

## 2014-11-21 NOTE — Telephone Encounter (Signed)
rx called into pharmacy

## 2014-11-21 NOTE — Telephone Encounter (Signed)
Okay to refill if due Check with pharmacist All his meds are administered by home health nurses

## 2014-11-21 NOTE — Telephone Encounter (Signed)
11/07/14 

## 2014-12-06 DIAGNOSIS — Z930 Tracheostomy status: Secondary | ICD-10-CM | POA: Diagnosis not present

## 2014-12-06 DIAGNOSIS — Z430 Encounter for attention to tracheostomy: Secondary | ICD-10-CM | POA: Diagnosis not present

## 2014-12-06 DIAGNOSIS — Z435 Encounter for attention to cystostomy: Secondary | ICD-10-CM | POA: Diagnosis not present

## 2014-12-06 DIAGNOSIS — R338 Other retention of urine: Secondary | ICD-10-CM | POA: Diagnosis not present

## 2014-12-07 ENCOUNTER — Ambulatory Visit: Payer: Medicare Other | Admitting: Internal Medicine

## 2014-12-07 ENCOUNTER — Encounter: Payer: Self-pay | Admitting: Internal Medicine

## 2014-12-07 VITALS — BP 90/50 | HR 120 | Resp 16

## 2014-12-07 DIAGNOSIS — G710 Muscular dystrophy: Secondary | ICD-10-CM

## 2014-12-07 DIAGNOSIS — Z23 Encounter for immunization: Secondary | ICD-10-CM | POA: Diagnosis not present

## 2014-12-07 DIAGNOSIS — Z9359 Other cystostomy status: Secondary | ICD-10-CM

## 2014-12-07 DIAGNOSIS — K3184 Gastroparesis: Secondary | ICD-10-CM | POA: Diagnosis not present

## 2014-12-07 DIAGNOSIS — Z9911 Dependence on respirator [ventilator] status: Secondary | ICD-10-CM | POA: Diagnosis not present

## 2014-12-07 DIAGNOSIS — R532 Functional quadriplegia: Secondary | ICD-10-CM | POA: Diagnosis not present

## 2014-12-07 DIAGNOSIS — Z930 Tracheostomy status: Secondary | ICD-10-CM

## 2014-12-07 DIAGNOSIS — G7101 Duchenne or Becker muscular dystrophy: Secondary | ICD-10-CM

## 2014-12-07 NOTE — Assessment & Plan Note (Signed)
Ongoing bowel issues and discomfort Most likely primarily GI but could be decreased oxygen delivery with his cardiomyopathy

## 2014-12-07 NOTE — Assessment & Plan Note (Signed)
No problems with trach Changed monthly

## 2014-12-07 NOTE — Assessment & Plan Note (Signed)
End stage with all organ systems involved No Rx available

## 2014-12-07 NOTE — Assessment & Plan Note (Signed)
In bed except 3-4 hours a day So light he can be picked up for transfer

## 2014-12-07 NOTE — Progress Notes (Signed)
Subjective:    Patient ID: [name], male    DOB: 04/26/1980, 34 y.o.   MRN: [id number]  HPI Home visit for follow up of multiple chronic medical conditions [name] -- nurse is here  Ongoing abdominal issues Gas, pain Bowels are okay Still has chronic pain and bloating after eating  Still with thigh and foot pain Uses tramadol tid usually Using lidoderm for the foot also Stopped the aleve--concerned it could be adding to abdominal problems. No change in pain, abdomen ?slightly better  Trouble with his vent New vent for trial run (from [facility]) -not happy with it "doesn't feel good--doesn't breathe like I am used to" Feels like "it is always blowing" Multiple different settings per pulmonologist-but still not happy Still happy with the portable vent in his wheelchair Increased secretions, etc It has been "a horrible month"  Still up in power chair 3-4 hours Gets 2 meals then Snack in evening sometimes  Still happy with suprapubic catheter Granuloma gone and healed well after Rx last visit  Current Outpatient Prescriptions on File Prior to Visit  Medication Sig Dispense Refill  . acetaminophen (TYLENOL) 500 MG tablet Take 500 mg by mouth every 6 (six) hours as needed.      . ALPRAZolam (XANAX) 0.5 MG tablet Take 1 tablet (0.5 mg total) by mouth 3 (three) times daily as needed. 90 tablet 0  . LINZESS 145 MCG CAPS capsule TAKE 1-2 CAPSULES (145-290 MCG TOTAL) BY MOUTH DAILY. 60 capsule 11  . Multiple Vitamins-Minerals (ONE-A-DAY VITACRAVES IMMUNITY) CHEW Chew 1 tablet by mouth daily.    . Naproxen Sodium 220 MG CAPS Take 440 mg by mouth daily.    . phenazopyridine (PYRIDIUM) 100 MG tablet Take 1 tablet (100 mg total) by mouth 3 (three) times daily as needed (bladder spasm). 30 tablet 0  . polyethylene glycol (MIRALAX / GLYCOLAX) packet Take 17 g by mouth daily as needed.     . simethicone (MYLICON) 80 MG chewable tablet Chew 320 mg by mouth 4 (four) times daily as needed.      . traMADol (ULTRAM) 50 MG tablet TAKE 1-2 TABLETS BY MOUTH EVERY 4 HOURS AS NEEDED AND 2 TABLETS AT BEDTIME 90 tablet 0   No current facility-administered medications on file prior to visit.    Allergies  Allergen Reactions  . Cephalexin     REACTION: GI distress  . Penicillins     REACTION: rash only  . Pregabalin     REACTION: urinary retention    Past Medical History  Diagnosis Date  . Duchenne muscular dystrophy     with associated cardiomyopathy  . IBS (irritable bowel syndrome)   . Neuropathy     Past Surgical History  Procedure Laterality Date  . Pneumonia  6/02    Resp failure--J tube for a while then removed  . Scoliosis repair  ~1991    Thoracic--with Harrington rods  . Abdominal distention  4/05    Laparotomy negative  . Pneumonia  11/06  . Suprapubic catheter insertion  5/14    [facility]  . Suprapubic catheter insertion  5/14    Family History  Problem Relation Age of Onset  . Diabetes Neg Hx   . Heart disease Neg Hx   . Hypertension Neg Hx   . Cancer Neg Hx     Social History   Social History  . Marital Status: Single    Spouse Name: N/A  . Number of Children: 0  . Years of Education: N/A  Occupational History  . Diabled    Social History Main Topics  . Smoking status: Never Smoker   . Smokeless tobacco: Never Used  . Alcohol Use: No  . Drug Use: No  . Sexual Activity: Not on file   Other Topics Concern  . Not on file   Social History Narrative       Review of Systems Appetite is fair--has to force himself to eat Doesn't sleep well--usually 3 hours a night. Rarely naps Still likes to explore on the computer Weight seems to be about the same    Objective:   Physical Exam  Constitutional: No distress.  No muscle at all  Neck: No thyromegaly present.  Cardiovascular: Regular rhythm and normal heart sounds.  Exam reveals no gallop.   No murmur heard. Fast as usual  Pulmonary/Chest: Effort normal and breath sounds normal. No  respiratory distress. He has no wheezes. He has no rales.  Abdominal: Soft. He exhibits no distension. There is no tenderness.  Lymphadenopathy:    He has no cervical adenopathy.  Neurological:  Decreased tone No movement other than face  Psychiatric: He has a normal mood and affect. His behavior is normal.          Assessment & Plan:

## 2014-12-07 NOTE — Assessment & Plan Note (Signed)
Not happy with trial on new vent Working with respiratory therapy and [facility] pulmonary

## 2014-12-07 NOTE — Assessment & Plan Note (Signed)
Still happy with this

## 2014-12-08 DIAGNOSIS — I420 Dilated cardiomyopathy: Secondary | ICD-10-CM | POA: Diagnosis not present

## 2014-12-08 DIAGNOSIS — G710 Muscular dystrophy: Secondary | ICD-10-CM | POA: Diagnosis not present

## 2014-12-08 NOTE — Addendum Note (Signed)
Addended by: Despina Hidden on: 12/08/2014 02:23 PM   Modules accepted: Orders

## 2014-12-20 ENCOUNTER — Telehealth: Payer: Self-pay | Admitting: Internal Medicine

## 2014-12-20 NOTE — Telephone Encounter (Signed)
Pt dropping off form that needs to be filled out for [employer]. Please mail in SASE envelope Placing on Dee's desk  Thanks

## 2014-12-20 NOTE — Telephone Encounter (Signed)
Form on your desk  

## 2014-12-21 NOTE — Telephone Encounter (Signed)
Done No charge

## 2014-12-21 NOTE — Telephone Encounter (Signed)
Form mailed to [employer].

## 2015-01-02 ENCOUNTER — Other Ambulatory Visit: Payer: Self-pay | Admitting: Internal Medicine

## 2015-01-02 NOTE — Telephone Encounter (Signed)
Approved: #[id number] x 0 

## 2015-01-02 NOTE — Telephone Encounter (Signed)
11/21/2014 

## 2015-01-02 NOTE — Telephone Encounter (Signed)
rx called into pharmacy

## 2015-01-18 DIAGNOSIS — Z430 Encounter for attention to tracheostomy: Secondary | ICD-10-CM | POA: Diagnosis not present

## 2015-01-18 DIAGNOSIS — Z930 Tracheostomy status: Secondary | ICD-10-CM | POA: Diagnosis not present

## 2015-01-18 DIAGNOSIS — Z435 Encounter for attention to cystostomy: Secondary | ICD-10-CM | POA: Diagnosis not present

## 2015-01-18 DIAGNOSIS — R338 Other retention of urine: Secondary | ICD-10-CM | POA: Diagnosis not present

## 2015-01-31 ENCOUNTER — Other Ambulatory Visit: Payer: Self-pay | Admitting: Internal Medicine

## 2015-01-31 NOTE — Telephone Encounter (Signed)
01/02/2015 

## 2015-02-01 NOTE — Telephone Encounter (Signed)
Approved: #[id number] x 0 

## 2015-02-01 NOTE — Telephone Encounter (Signed)
rx called into pharmacy

## 2015-02-28 ENCOUNTER — Other Ambulatory Visit: Payer: Self-pay | Admitting: Internal Medicine

## 2015-03-01 NOTE — Telephone Encounter (Signed)
Approved: #[id number] x 0 

## 2015-03-01 NOTE — Telephone Encounter (Signed)
rx called into pharmacy

## 2015-03-01 NOTE — Telephone Encounter (Signed)
04/14/14 

## 2015-03-06 ENCOUNTER — Other Ambulatory Visit: Payer: Self-pay | Admitting: Internal Medicine

## 2015-03-07 DIAGNOSIS — J9612 Chronic respiratory failure with hypercapnia: Secondary | ICD-10-CM | POA: Diagnosis not present

## 2015-03-07 DIAGNOSIS — J9610 Chronic respiratory failure, unspecified whether with hypoxia or hypercapnia: Secondary | ICD-10-CM | POA: Diagnosis not present

## 2015-03-07 NOTE — Telephone Encounter (Signed)
Approved: #[id number] x 0 

## 2015-03-07 NOTE — Telephone Encounter (Signed)
rx called into pharmacy

## 2015-03-07 NOTE — Telephone Encounter (Signed)
02/01/2015 

## 2015-03-08 ENCOUNTER — Ambulatory Visit: Payer: Medicare Other | Admitting: Internal Medicine

## 2015-03-08 ENCOUNTER — Encounter: Payer: Self-pay | Admitting: Internal Medicine

## 2015-03-08 VITALS — BP 84/54 | HR 106 | Resp 14

## 2015-03-08 DIAGNOSIS — G629 Polyneuropathy, unspecified: Secondary | ICD-10-CM | POA: Diagnosis not present

## 2015-03-08 DIAGNOSIS — F39 Unspecified mood [affective] disorder: Secondary | ICD-10-CM

## 2015-03-08 DIAGNOSIS — Z9911 Dependence on respirator [ventilator] status: Secondary | ICD-10-CM

## 2015-03-08 DIAGNOSIS — I429 Cardiomyopathy, unspecified: Secondary | ICD-10-CM

## 2015-03-08 DIAGNOSIS — G710 Muscular dystrophy: Secondary | ICD-10-CM

## 2015-03-08 DIAGNOSIS — I43 Cardiomyopathy in diseases classified elsewhere: Secondary | ICD-10-CM

## 2015-03-08 DIAGNOSIS — G7101 Duchenne or Becker muscular dystrophy: Secondary | ICD-10-CM

## 2015-03-08 NOTE — Assessment & Plan Note (Signed)
Stable at this point [facility] 24/7 nursing with [facility]

## 2015-03-08 NOTE — Assessment & Plan Note (Signed)
Persistent tachycardia but no CHF

## 2015-03-08 NOTE — Assessment & Plan Note (Signed)
Gets lonely but nothing that needs Rx Rare alprazolam for nerves

## 2015-03-08 NOTE — Assessment & Plan Note (Signed)
Never adjusted to the new vent Now has backup of old one---hopefully will be good with this for a while

## 2015-03-08 NOTE — Progress Notes (Signed)
Subjective:    Patient ID: [name], male    DOB: 01-29-1981, 34 y.o.   MRN: [id number]  HPI Home visit for review of chronic medical problems [name] RN from [place] here  The new ventilator didn't work out Caused more secretions Didn't feel airflow was correct--may not have been sensing his breaths correctly Back to the old machine --and now has a backup (at bedside for in bed) Still satisfied with the one on his wheelchair  Still up to power chair for a few hours daily 2 meals then Then mostly in bed Snack at bedtime 24/7 nurse coverage  Stomach seems better Lots of cramps for a while---stopped the linzess. Then got constipated so back on Bowels are okay again  Still happy with urostomy Urine output fine  Good pain control with the tramadol Frustrated because he ran out Discussed our refill policy---but will try to remember to increase to #120 next time Back to using the aleve at times  Rarely needs the alprazolam--but occasionally  Current Outpatient Prescriptions on File Prior to Visit  Medication Sig Dispense Refill  . acetaminophen (TYLENOL) 500 MG tablet Take 500 mg by mouth every 6 (six) hours as needed.      . ALPRAZolam (XANAX) 0.5 MG tablet TAKE 1 TABLET BY MOUTH 3 TIMES A DAY AS NEEDED 90 tablet 0  . lidocaine (LIDODERM) 5 % Place 1 patch onto the skin daily. Remove & Discard patch within 12 hours or as directed by MD    . Karlene Einstein 145 MCG CAPS capsule TAKE 1-2 CAPSULES (145-290 MCG TOTAL) BY MOUTH DAILY. 60 capsule 11  . Multiple Vitamins-Minerals (ONE-A-DAY VITACRAVES IMMUNITY) CHEW Chew 1 tablet by mouth daily.    . Naproxen Sodium 220 MG CAPS Take 220 mg by mouth 2 (two) times daily as needed.     . phenazopyridine (PYRIDIUM) 100 MG tablet Take 1 tablet (100 mg total) by mouth 3 (three) times daily as needed (bladder spasm). (Patient taking differently: Take 100 mg by mouth 3 (three) times daily as needed. ) 30 tablet 0  . polyethylene glycol (MIRALAX  / GLYCOLAX) packet Take 17 g by mouth daily as needed.     . simethicone (MYLICON) 80 MG chewable tablet Chew 320 mg by mouth 4 (four) times daily as needed.     . traMADol (ULTRAM) 50 MG tablet TAKE 1 TO 2 TABLETS BY MOUTH EVERY 4 HOURS AS NEEDED AND 2 TABLETS BY MOUTH AT BEDTIME AS NEEDED 90 tablet 0   No current facility-administered medications on file prior to visit.    Allergies  Allergen Reactions  . Cephalexin     REACTION: GI distress  . Penicillins     REACTION: rash only  . Pregabalin     REACTION: urinary retention    Past Medical History  Diagnosis Date  . Duchenne muscular dystrophy (HCC)     with associated cardiomyopathy  . IBS (irritable bowel syndrome)   . Neuropathy [facility])     Past Surgical History  Procedure Laterality Date  . Pneumonia  6/02    Resp failure--J tube for a while then removed  . Scoliosis repair  ~1991    Thoracic--with Harrington rods  . Abdominal distention  4/05    Laparotomy negative  . Pneumonia  11/06  . Suprapubic catheter insertion  5/14    [facility]  . Suprapubic catheter insertion  5/14    Family History  Problem Relation Age of Onset  . Diabetes Neg Hx   .  Heart disease Neg Hx   . Hypertension Neg Hx   . Cancer Neg Hx     Social History   Social History  . Marital Status: Single    Spouse Name: N/A  . Number of Children: 0  . Years of Education: N/A   Occupational History  . Diabled    Social History Main Topics  . Smoking status: Never Smoker   . Smokeless tobacco: Never Used  . Alcohol Use: No  . Drug Use: No  . Sexual Activity: Not on file   Other Topics Concern  . Not on file   Social History Narrative       Review of Systems Appetite is okay Still stays up till early AM before sleeping---only problem is this schedule doesn't match his family's No skin breakdown Mood has been okay--lonely at times    Objective:   Physical Exam  Constitutional: No distress.  Neck: No thyromegaly present.    Cardiovascular: Regular rhythm and normal heart sounds.  Exam reveals no gallop.   No murmur heard. Pulmonary/Chest: Effort normal and breath sounds normal. No respiratory distress. He has no wheezes. He has no rales.  No respiratory effort by him  Abdominal: Soft. There is no tenderness.  Musculoskeletal: He exhibits no edema.  Lymphadenopathy:    He has no cervical adenopathy.  Neurological:  Hypotonic No functional movement  Skin: No rash noted.  Psychiatric: He has a normal mood and affect. His behavior is normal.          Assessment & Plan:

## 2015-03-08 NOTE — Assessment & Plan Note (Signed)
Satisfied with pain relief with the tramadol

## 2015-03-31 ENCOUNTER — Other Ambulatory Visit: Payer: Self-pay | Admitting: Internal Medicine

## 2015-04-04 NOTE — Telephone Encounter (Signed)
Dr. Everardo Beals last note reviewed. He instructed to increase to #120. Ok to phone in Tramadol.

## 2015-04-04 NOTE — Telephone Encounter (Signed)
Pt requesting status of tramadol refill.Medication phoned to [place] as instructed. Pt notified done.

## 2015-04-04 NOTE — Telephone Encounter (Signed)
Ok to refill but I am not in office this week either.  Thanks!

## 2015-04-04 NOTE — Telephone Encounter (Signed)
03/07/2015 last filled, [facility] PATIENT, Please send to someone in the office for call in

## 2015-04-10 DIAGNOSIS — Z430 Encounter for attention to tracheostomy: Secondary | ICD-10-CM

## 2015-04-10 DIAGNOSIS — J9690 Respiratory failure, unspecified, unspecified whether with hypoxia or hypercapnia: Secondary | ICD-10-CM | POA: Diagnosis not present

## 2015-04-10 DIAGNOSIS — R338 Other retention of urine: Secondary | ICD-10-CM | POA: Diagnosis not present

## 2015-04-10 DIAGNOSIS — Z435 Encounter for attention to cystostomy: Secondary | ICD-10-CM | POA: Diagnosis not present

## 2015-04-10 DIAGNOSIS — Z930 Tracheostomy status: Secondary | ICD-10-CM | POA: Diagnosis not present

## 2015-05-06 ENCOUNTER — Other Ambulatory Visit: Payer: Self-pay | Admitting: Internal Medicine

## 2015-05-11 ENCOUNTER — Telehealth: Payer: Self-pay | Admitting: Internal Medicine

## 2015-05-11 MED ORDER — TEMAZEPAM 15 MG PO CAPS: 15.0000 mg | ORAL_CAPSULE | Freq: Every evening | ORAL | Status: DC | PRN

## 2015-05-11 NOTE — Telephone Encounter (Signed)
Call from [place] from [place] He would like to try the temazepam again

## 2015-05-12 ENCOUNTER — Other Ambulatory Visit: Payer: Self-pay | Admitting: Internal Medicine

## 2015-05-13 NOTE — Telephone Encounter (Signed)
Last filled 04/04/15 #120--please advise

## 2015-05-13 NOTE — Telephone Encounter (Signed)
Approved: #120 x 0

## 2015-05-15 DIAGNOSIS — J9690 Respiratory failure, unspecified, unspecified whether with hypoxia or hypercapnia: Secondary | ICD-10-CM | POA: Diagnosis not present

## 2015-05-15 DIAGNOSIS — R338 Other retention of urine: Secondary | ICD-10-CM | POA: Diagnosis not present

## 2015-05-15 DIAGNOSIS — Z930 Tracheostomy status: Secondary | ICD-10-CM | POA: Diagnosis not present

## 2015-05-15 DIAGNOSIS — Z430 Encounter for attention to tracheostomy: Secondary | ICD-10-CM

## 2015-05-15 DIAGNOSIS — Z435 Encounter for attention to cystostomy: Secondary | ICD-10-CM | POA: Diagnosis not present

## 2015-05-15 NOTE — Telephone Encounter (Signed)
rx called into pharmacy

## 2015-05-31 ENCOUNTER — Ambulatory Visit: Payer: Medicare Other | Admitting: Internal Medicine

## 2015-05-31 ENCOUNTER — Encounter: Payer: Self-pay | Admitting: Internal Medicine

## 2015-05-31 VITALS — BP 90/50 | HR 86 | Resp 14

## 2015-05-31 DIAGNOSIS — Z930 Tracheostomy status: Secondary | ICD-10-CM

## 2015-05-31 DIAGNOSIS — F39 Unspecified mood [affective] disorder: Secondary | ICD-10-CM

## 2015-05-31 DIAGNOSIS — I429 Cardiomyopathy, unspecified: Secondary | ICD-10-CM

## 2015-05-31 DIAGNOSIS — Z9359 Other cystostomy status: Secondary | ICD-10-CM

## 2015-05-31 DIAGNOSIS — G710 Muscular dystrophy: Secondary | ICD-10-CM | POA: Diagnosis not present

## 2015-05-31 DIAGNOSIS — K3184 Gastroparesis: Secondary | ICD-10-CM

## 2015-05-31 DIAGNOSIS — G629 Polyneuropathy, unspecified: Secondary | ICD-10-CM

## 2015-05-31 DIAGNOSIS — R532 Functional quadriplegia: Secondary | ICD-10-CM

## 2015-05-31 DIAGNOSIS — I43 Cardiomyopathy in diseases classified elsewhere: Secondary | ICD-10-CM

## 2015-05-31 DIAGNOSIS — G7101 Duchenne or Becker muscular dystrophy: Secondary | ICD-10-CM

## 2015-05-31 DIAGNOSIS — Z9911 Dependence on respirator [ventilator] status: Secondary | ICD-10-CM | POA: Diagnosis not present

## 2015-05-31 MED ORDER — PHENAZOPYRIDINE HCL 100 MG PO TABS: 100.0000 mg | ORAL_TABLET | Freq: Three times a day (TID) | ORAL | Status: AC

## 2015-05-31 NOTE — Assessment & Plan Note (Signed)
Has never had symptoms Not tachycardic now--so doesn't seem to be any worse

## 2015-05-31 NOTE — Progress Notes (Signed)
Subjective:    Patient ID: [name], male    DOB: 06/19/1980, 35 y.o.   MRN: [id number]  HPI Visit for follow up of chronic medical conditions [name] RN is here  Had retried the temazepam 3 times Not sure that he is satisfied with it Night nurse thinks it may be helping Still up till at least 1-2AM--even taking it at 10PM Definitely not satisfied with the lunesta Overall quality of life has declined considerably--- "grabbing at straws" to try to improve things Feels the sleep issue is the biggest QOL issue  Still taking the tramadol for the nerve pain at night Takes total of 3--- 2 at first and then another 2 hours later Uses the alprazolam also ---doesn't make him drowsy like it did before Feels tense at times during the day--- alprazolam helps but doesn't make him drowsy then  Also still with the linzess Seems to be helping at least a little Has tried off it and does worsen Takes the simethicone up to 6 times a day--seems to help a little  Still up in afternoon for a few hours--and has 2 meals Not consistent with evening snack Rest of time in bed One person lift due to his light weight  Still okay with the ventilator No SOB Typical trach secretions---does occasionally have plugs Suctioned at least 12 times per day in general  Still satisfied with the suprapubic catheter Has granuloma formation again--occ blood tinged Seems to worsen due to his chronic recurrent distention  Current Outpatient Prescriptions on File Prior to Visit  Medication Sig Dispense Refill  . acetaminophen (TYLENOL) 500 MG tablet Take 500 mg by mouth every 6 (six) hours as needed.      . ALPRAZolam (XANAX) 0.5 MG tablet TAKE 1 TABLET BY MOUTH 3 TIMES A DAY AS NEEDED 90 tablet 0  . lidocaine (LIDODERM) 5 % Place 1 patch onto the skin daily as needed.    Marland Kitchen LINZESS 145 MCG CAPS capsule TAKE 1-2 CAPSULES (145-290 MCG TOTAL) BY MOUTH DAILY. 60 capsule 11  . Multiple Vitamins-Minerals (ONE-A-DAY  VITACRAVES IMMUNITY) CHEW Chew 1 tablet by mouth daily.    . Naproxen Sodium 220 MG CAPS Take 220 mg by mouth 2 (two) times daily as needed.     . polyethylene glycol (MIRALAX / GLYCOLAX) packet Take 17 g by mouth daily as needed.     . simethicone (MYLICON) 80 MG chewable tablet Chew 320 mg by mouth 6 (six) times daily.    . traMADol (ULTRAM) 50 MG tablet TAKE 1 TO 2 TABLETS BY MOUTH EVERY 4 HOURS AS NEEDED AND 2 TABLETS AT BEDTIME AS NEEDED 120 tablet 0   No current facility-administered medications on file prior to visit.    Allergies  Allergen Reactions  . Cephalexin     REACTION: GI distress  . Penicillins     REACTION: rash only  . Pregabalin     REACTION: urinary retention    Past Medical History  Diagnosis Date  . Duchenne muscular dystrophy ([place])     with associated cardiomyopathy  . IBS (irritable bowel syndrome)   . Neuropathy [facility])     Past Surgical History  Procedure Laterality Date  . Pneumonia  6/02    Resp failure--J tube for a while then removed  . Scoliosis repair  ~1991    Thoracic--with Harrington rods  . Abdominal distention  4/05    Laparotomy negative  . Pneumonia  11/06  . Suprapubic catheter insertion  5/14  [facility]  . Suprapubic catheter insertion  5/14    Family History  Problem Relation Age of Onset  . Diabetes Neg Hx   . Heart disease Neg Hx   . Hypertension Neg Hx   . Cancer Neg Hx     Social History   Social History  . Marital Status: Single    Spouse Name: N/A  . Number of Children: 0  . Years of Education: N/A   Occupational History  . Diabled    Social History Main Topics  . Smoking status: Never Smoker   . Smokeless tobacco: Never Used  . Alcohol Use: No  . Drug Use: No  . Sexual Activity: Not on file   Other Topics Concern  . Not on file   Social History Narrative       Review of Systems Appetite is about the same Did have flare of sacral decubitus--- better with telfa and neosporin No cough    Objective:     Physical Exam  Neck: No thyromegaly present.  Cardiovascular: Normal rate, regular rhythm and normal heart sounds.  Exam reveals no gallop.   No murmur heard. Pulmonary/Chest: Effort normal and breath sounds normal. No respiratory distress. He has no wheezes. He has no rales.  Abdominal: Soft. There is no tenderness.  Genitourinary:  Granuloma at base of suprapubic catheter---- cauterized with silver nitrate (tolerated well)  Musculoskeletal: He exhibits no edema.  Lymphadenopathy:    He has no cervical adenopathy.  Neurological:  Marked decreased tone  Psychiatric: He has a normal mood and affect. His behavior is normal.  Frustrated by sleep problems          Assessment & Plan:

## 2015-05-31 NOTE — Assessment & Plan Note (Signed)
Mostly gets mind racing and can't sleep Biggest QOL issue Temazepam gave hang over effect Will try adding second evening alprazolam (like 2 hours after other dose--- 10PM, midnight). Could also get 4th tramadol (which he usually isn't needing)

## 2015-05-31 NOTE — Assessment & Plan Note (Signed)
Satisfied with ventilator No dyspnea

## 2015-05-31 NOTE — Assessment & Plan Note (Signed)
Bed and power chair bound Able to run wheelchair with right thumb and finger usually Now has button for tongue and cheek control on chair--controls computer

## 2015-05-31 NOTE — Assessment & Plan Note (Signed)
Ongoing issue Uses the simethicone every 30 minutes in evening after eating at times linzess does help some

## 2015-05-31 NOTE — Assessment & Plan Note (Signed)
Doing well Trach changed monthly

## 2015-05-31 NOTE — Assessment & Plan Note (Signed)
Doing well with this Granuloma cauterized successfully

## 2015-05-31 NOTE — Assessment & Plan Note (Signed)
Mostly has pain in evening--the tramadol has been best for controlling this

## 2015-05-31 NOTE — Assessment & Plan Note (Signed)
End stage Probably weighs 40-50# Total dependence

## 2015-06-20 ENCOUNTER — Other Ambulatory Visit: Payer: Self-pay | Admitting: Internal Medicine

## 2015-06-21 NOTE — Telephone Encounter (Signed)
Rx called in as directed.   

## 2015-06-21 NOTE — Telephone Encounter (Signed)
Approved: #120 x 5

## 2015-06-21 NOTE — Telephone Encounter (Signed)
Last refill 05-15-15 #120/0 Last OV 05-31-15 No new visit scheduled

## 2015-06-29 DIAGNOSIS — J9690 Respiratory failure, unspecified, unspecified whether with hypoxia or hypercapnia: Secondary | ICD-10-CM | POA: Diagnosis not present

## 2015-06-29 DIAGNOSIS — Z930 Tracheostomy status: Secondary | ICD-10-CM | POA: Diagnosis not present

## 2015-06-29 DIAGNOSIS — R338 Other retention of urine: Secondary | ICD-10-CM | POA: Diagnosis not present

## 2015-06-29 DIAGNOSIS — Z435 Encounter for attention to cystostomy: Secondary | ICD-10-CM | POA: Diagnosis not present

## 2015-06-29 DIAGNOSIS — Z430 Encounter for attention to tracheostomy: Secondary | ICD-10-CM

## 2015-07-03 ENCOUNTER — Telehealth: Payer: Self-pay

## 2015-07-03 NOTE — Telephone Encounter (Signed)
pts father (do not see DRP signed) left v/m; pt is not going to sleep until the morning when it is time to wake up; not sure if anxiety or what cause is of insomnia. [facility]; pts father request cb. 05/31/15 home visit.

## 2015-07-03 NOTE — Telephone Encounter (Signed)
Still sleeping till 2PM and not getting to sleep till early AM Won't let them turn the TV off Extra xanax affects him in the morning  Needs better sleep hygiene Discussed TV off at 2AM Needs to be awoken regularly at reasonable time---for him ~11:30AM Dad is going to try valerian also If all this not effective, we will consider going back to [place]

## 2015-07-16 ENCOUNTER — Other Ambulatory Visit: Payer: Self-pay | Admitting: Internal Medicine

## 2015-07-17 NOTE — Telephone Encounter (Signed)
Last filled 03-01-15 #90 Last OV 05-31-15 No Future visit

## 2015-07-17 NOTE — Telephone Encounter (Signed)
Approved: #90 x 2

## 2015-07-18 NOTE — Telephone Encounter (Signed)
Left refill on voice mail at pharmacy  

## 2015-08-23 ENCOUNTER — Encounter: Payer: Self-pay | Admitting: Internal Medicine

## 2015-08-23 ENCOUNTER — Ambulatory Visit: Payer: Medicare Other | Admitting: Internal Medicine

## 2015-08-23 VITALS — BP 80/50 | HR 96 | Temp 97.4°F | Resp 16

## 2015-08-23 DIAGNOSIS — G7101 Duchenne or Becker muscular dystrophy: Secondary | ICD-10-CM

## 2015-08-23 DIAGNOSIS — F39 Unspecified mood [affective] disorder: Secondary | ICD-10-CM

## 2015-08-23 DIAGNOSIS — G629 Polyneuropathy, unspecified: Secondary | ICD-10-CM

## 2015-08-23 DIAGNOSIS — Z9911 Dependence on respirator [ventilator] status: Secondary | ICD-10-CM

## 2015-08-23 DIAGNOSIS — I43 Cardiomyopathy in diseases classified elsewhere: Secondary | ICD-10-CM

## 2015-08-23 DIAGNOSIS — G710 Muscular dystrophy: Secondary | ICD-10-CM | POA: Diagnosis not present

## 2015-08-23 DIAGNOSIS — I429 Cardiomyopathy, unspecified: Secondary | ICD-10-CM

## 2015-08-23 DIAGNOSIS — R532 Functional quadriplegia: Secondary | ICD-10-CM | POA: Diagnosis not present

## 2015-08-23 MED ORDER — MIRTAZAPINE 15 MG PO TABS: 15.0000 mg | ORAL_TABLET | Freq: Every day | ORAL | Status: DC

## 2015-08-23 NOTE — Assessment & Plan Note (Addendum)
Has become more clearly depressed Daily Has lost desire to go out (like with family to restaurant) No longer optimistic in general Will try antidepressant--- mirtazapine (in case it will help sleep also)

## 2015-08-23 NOTE — Assessment & Plan Note (Signed)
transfered without lift due to light weight Chair okay

## 2015-08-23 NOTE — Assessment & Plan Note (Signed)
End stage but fairly stable Total care

## 2015-08-23 NOTE — Progress Notes (Signed)
Subjective:    Patient ID: [name], male    DOB: 1981-03-12, 35 y.o.   MRN: [id number]  HPI Home visit for review of chronic health issues [name], nurse, is here  Had bad night last night Abdominal pain was bad last night--tramadol does help Did try increasing the alprazolam at night as I suggested he try---taking 2, even early in evening, still caused AM sedation.  Still on the linzess--hard to tell but it seems to still help When he tried cutting back to 1 daily--he noticed bowels worsened (and better going back up) Still takes miralax most days Simethicone at least 4-5 times a day  Still on the same old vent Did have trouble adjusting to the new vents--but no more service available for this Breathing is okay  Still bed/chair bound Up once a day for 2 meals still  Current Outpatient Prescriptions on File Prior to Visit  Medication Sig Dispense Refill  . acetaminophen (TYLENOL) 500 MG tablet Take 500 mg by mouth every 6 (six) hours as needed.      . ALPRAZolam (XANAX) 0.5 MG tablet TAKE 1 TABLET BY MOUTH 3 TIMES A DAY AS NEEDED 90 tablet 2  . lidocaine (LIDODERM) 5 % Place 1 patch onto the skin daily as needed.    Marland Kitchen LINZESS 145 MCG CAPS capsule TAKE 1-2 CAPSULES (145-290 MCG TOTAL) BY MOUTH DAILY. 60 capsule 11  . Multiple Vitamins-Minerals (ONE-A-DAY VITACRAVES IMMUNITY) CHEW Chew 1 tablet by mouth daily.    . Naproxen Sodium 220 MG CAPS Take 220 mg by mouth 2 (two) times daily as needed.     . phenazopyridine (PYRIDIUM) 100 MG tablet Take 1 tablet (100 mg total) by mouth 3 (three) times daily with meals. 90 tablet 11  . polyethylene glycol (MIRALAX / GLYCOLAX) packet Take 17 g by mouth daily as needed.     . simethicone (MYLICON) 80 MG chewable tablet Chew 320 mg by mouth 6 (six) times daily.    . traMADol (ULTRAM) 50 MG tablet TAKE 1 TO 2 TABS EVERY 4 HOURS AS NEEDED AND TAKE 2 TABS AT BEDIME 120 tablet 5   No current facility-administered medications on file prior  to visit.    Allergies  Allergen Reactions  . Cephalexin     REACTION: GI distress  . Penicillins     REACTION: rash only  . Pregabalin     REACTION: urinary retention    Past Medical History  Diagnosis Date  . 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 IBS (irritable bowel syndrome)   . Neuropathy [facility])     Past Surgical History  Procedure Laterality Date  . Pneumonia  6/02    Resp failure--J tube for a while then removed  . Scoliosis repair  ~1991    Thoracic--with Harrington rods  . Abdominal distention  4/05    Laparotomy negative  . Pneumonia  11/06  . Suprapubic catheter insertion  5/14    [facility]  . Suprapubic catheter insertion  5/14    Family History  Problem Relation Age of Onset  . Diabetes Neg Hx   . Heart disease Neg Hx   . Hypertension Neg Hx   . Cancer Neg Hx     Social History   Social History  . Marital Status: Single    Spouse Name: N/A  . Number of Children: 0  . Years of Education: N/A   Occupational History  . Diabled    Social History  Main Topics  . Smoking status: Never Smoker   . Smokeless tobacco: Never Used  . Alcohol Use: No  . Drug Use: No  . Sexual Activity: Not on file   Other Topics Concern  . Not on file   Social History Narrative       Review of Systems Weight seems table No joint pains Suprapubic catheter still working well. Did well after the silver nitrate treatment last time for the granuloma there No skin breakdown    Objective:   Physical Exam  Constitutional:  In wheelchair, normal interaction  Neck: No thyromegaly present.  Cardiovascular: Normal rate, regular rhythm and normal heart sounds.  Exam reveals no gallop.   No murmur heard. Pulmonary/Chest: Effort normal and breath sounds normal. No respiratory distress. He has no wheezes. He has no rales.  Lymphadenopathy:    He has no cervical adenopathy.  Neurological:  Decreased tone Contractures in knees/hands/elbows    Skin: No rash noted.  Psychiatric:  No longer "seeing the bright side of things"          Assessment & Plan:

## 2015-08-23 NOTE — Assessment & Plan Note (Addendum)
Doing well on current vent but will need to change Hasn't done well with newer vent--but will have to adjust when change is made  Still uses the oxygen at night and has for prn in daytime

## 2015-08-23 NOTE — Assessment & Plan Note (Signed)
Ongoing pain especially at night Tramadol some help

## 2015-08-23 NOTE — Assessment & Plan Note (Signed)
Tachycardia less an issue lately

## 2015-09-01 ENCOUNTER — Telehealth: Payer: Self-pay

## 2015-09-01 NOTE — Telephone Encounter (Signed)
[name] with [facility] left v/m; pt was started on antidepressant; made pt feel"out of sorts" and pt has not been taking the medication. Request different med to Emma request cb.

## 2015-09-02 MED ORDER — DULOXETINE HCL 30 MG PO CPEP: 30.0000 mg | ORAL_CAPSULE | Freq: Every day | ORAL | Status: DC

## 2015-09-02 NOTE — Telephone Encounter (Signed)
Please call dad and let him know that I changed the med to a different antidepressant. Most antidepressants have a potential interaction with tramadol--so he should stop it if he feels bad, etc This one can help nerve type pain--so I am hoping it may help his pain as well as his mood

## 2015-09-04 NOTE — Telephone Encounter (Signed)
Spoke to dad. They will try the Cymbalta.

## 2015-09-04 NOTE — Telephone Encounter (Signed)
Left message for Stanton Kidney to call back

## 2015-09-07 DIAGNOSIS — J9690 Respiratory failure, unspecified, unspecified whether with hypoxia or hypercapnia: Secondary | ICD-10-CM | POA: Diagnosis not present

## 2015-09-07 DIAGNOSIS — Z930 Tracheostomy status: Secondary | ICD-10-CM | POA: Diagnosis not present

## 2015-09-07 DIAGNOSIS — Z435 Encounter for attention to cystostomy: Secondary | ICD-10-CM | POA: Diagnosis not present

## 2015-09-07 DIAGNOSIS — Z430 Encounter for attention to tracheostomy: Secondary | ICD-10-CM

## 2015-09-07 DIAGNOSIS — R338 Other retention of urine: Secondary | ICD-10-CM | POA: Diagnosis not present

## 2015-10-11 DIAGNOSIS — G5791 Unspecified mononeuropathy of right lower limb: Secondary | ICD-10-CM | POA: Diagnosis not present

## 2015-10-11 DIAGNOSIS — G894 Chronic pain syndrome: Secondary | ICD-10-CM | POA: Diagnosis not present

## 2015-10-11 DIAGNOSIS — R1030 Lower abdominal pain, unspecified: Secondary | ICD-10-CM | POA: Diagnosis not present

## 2015-10-11 DIAGNOSIS — M792 Neuralgia and neuritis, unspecified: Secondary | ICD-10-CM | POA: Diagnosis not present

## 2015-10-13 ENCOUNTER — Telehealth: Payer: Self-pay

## 2015-10-13 NOTE — Telephone Encounter (Signed)
Jeani Hawking with [facility] request 08/23/15 visit faxed to [phone] for pt recertification; advised done.

## 2015-10-16 NOTE — Telephone Encounter (Addendum)
[name] with [facility] received the 08/23/15 office note but request an addendum to the note that pt wears O2 nocturnally for recert of oxygen. Request faxed to [phone]. And [name] request cb also.

## 2015-10-16 NOTE — Telephone Encounter (Signed)
Note addended Please fax to them again

## 2015-10-16 NOTE — Telephone Encounter (Signed)
Addended note faxed to [facility] at [facility] at [phone]

## 2015-10-22 ENCOUNTER — Other Ambulatory Visit: Payer: Self-pay | Admitting: Internal Medicine

## 2015-11-01 ENCOUNTER — Ambulatory Visit: Payer: Medicare Other | Admitting: Internal Medicine

## 2015-11-01 ENCOUNTER — Encounter: Payer: Self-pay | Admitting: Internal Medicine

## 2015-11-01 DIAGNOSIS — Z930 Tracheostomy status: Secondary | ICD-10-CM

## 2015-11-01 DIAGNOSIS — G7101 Duchenne or Becker muscular dystrophy: Secondary | ICD-10-CM

## 2015-11-01 DIAGNOSIS — G710 Muscular dystrophy: Secondary | ICD-10-CM | POA: Diagnosis not present

## 2015-11-01 DIAGNOSIS — Z9911 Dependence on respirator [ventilator] status: Secondary | ICD-10-CM

## 2015-11-01 DIAGNOSIS — G629 Polyneuropathy, unspecified: Secondary | ICD-10-CM | POA: Diagnosis not present

## 2015-11-01 DIAGNOSIS — G8250 Quadriplegia, unspecified: Secondary | ICD-10-CM | POA: Diagnosis not present

## 2015-11-01 DIAGNOSIS — Z9359 Other cystostomy status: Secondary | ICD-10-CM

## 2015-11-01 DIAGNOSIS — F39 Unspecified mood [affective] disorder: Secondary | ICD-10-CM

## 2015-11-01 NOTE — Assessment & Plan Note (Signed)
Chair and bed bound Transfer by 1 person lift due to low weight Total care

## 2015-11-01 NOTE — Assessment & Plan Note (Signed)
No problems with this Changed twice a month

## 2015-11-01 NOTE — Assessment & Plan Note (Signed)
Some better Discussed increase in duloxetine but we all agree not to  Discussed being up out of bed more--and get out of room (he is reluctant)

## 2015-11-01 NOTE — Assessment & Plan Note (Signed)
Working well and continues to be satisfied with this

## 2015-11-01 NOTE — Assessment & Plan Note (Signed)
Continues on tramadol No clear improvement on duloxetine

## 2015-11-01 NOTE — Assessment & Plan Note (Signed)
End stage but stable 24/7 nursing care

## 2015-11-01 NOTE — Progress Notes (Signed)
Subjective:    Patient ID: [name], male    DOB: November 22, 1980, 35 y.o.   MRN: [id number]  HPI Home visit for follow up of chronic health issues [name] is here from [place] has 24/7 nursing care  He didn't tolerate mirtazapine  Changed to duloxetine and he has tolerated that He feels he is "less dark" on it Hasn't helped his sleep---still 5AM to 2PM or so Quality of sleep isn't great either Mood not clearly better per nurse (who is here all the time) Dad here-- feels he is "a little more tolerable"---but frustrated by the ongoing sleep issues Anxiety may be better-- using the alprazolam less often  Still has 2 meals in wheelchair (3 hours) Snack in bed occasionally Reluctant to get out of bed a second time-- feels his stomach is better in bed  Ongoing pain issues Mostly at night Tramadol seems to help at least a little Not using the lidocaine patch as much since on duloxetine (and insurance now refusing--discussed OTC substitute)  No chest pain No SOB Does occasionally notice his heart going fast  Still doing great with the suprapubic catheter Flushed daily Bag changed every 2 weeks, catheter every month  Bowels still slow Daily miralax-- 1/4-1/2 capful Dulcolax tab occasionallly--it did work the last time (hates enemas)  Still on the old vent Breathing is fine and he likes this Holding off on the switch to newer machine--dreading it  Still lift and pivot to transfer-- 1 person able (even women)  Current Outpatient Prescriptions on File Prior to Visit  Medication Sig Dispense Refill  . acetaminophen (TYLENOL) 500 MG tablet Take 500 mg by mouth every 6 (six) hours as needed.      . ALPRAZolam (XANAX) 0.5 MG tablet TAKE 1 TABLET BY MOUTH 3 TIMES A DAY AS NEEDED 90 tablet 2  . DULoxetine (CYMBALTA) 30 MG capsule Take 1 capsule (30 mg total) by mouth daily. 30 capsule 3  . lidocaine (LIDODERM) 5 % Place 1 patch onto the skin daily as needed.    Marland Kitchen LINZESS  145 MCG CAPS capsule TAKE 1-2 CAPSULES (145-290 MCG TOTAL) BY MOUTH DAILY. 60 capsule 11  . Multiple Vitamins-Minerals (ONE-A-DAY VITACRAVES IMMUNITY) CHEW Chew 1 tablet by mouth daily.    . Naproxen Sodium 220 MG CAPS Take 220 mg by mouth 2 (two) times daily as needed.     . phenazopyridine (PYRIDIUM) 100 MG tablet Take 1 tablet (100 mg total) by mouth 3 (three) times daily with meals. 90 tablet 11  . polyethylene glycol (MIRALAX / GLYCOLAX) packet Take 17 g by mouth daily as needed.     . simethicone (MYLICON) 80 MG chewable tablet Chew 320 mg by mouth 6 (six) times daily.    . traMADol (ULTRAM) 50 MG tablet TAKE 1 TO 2 TABS EVERY 4 HOURS AS NEEDED AND TAKE 2 TABS AT BEDIME 120 tablet 5   No current facility-administered medications on file prior to visit.     Allergies  Allergen Reactions  . Cephalexin     REACTION: GI distress  . Penicillins     REACTION: rash only  . Pregabalin     REACTION: urinary retention    Past Medical History:  Diagnosis Date  . 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 IBS (irritable bowel syndrome)   . Neuropathy [facility])     Past Surgical History:  Procedure Laterality Date  . Abdominal distention  4/05   Laparotomy negative  .  Pneumonia  6/02   Resp failure--J tube for a while then removed  . Pneumonia  11/06  . Scoliosis repair  ~1991   Thoracic--with Harrington rods  . SUPRAPUBIC CATHETER INSERTION  5/14   [facility]  . SUPRAPUBIC CATHETER INSERTION  5/14    Family History  Problem Relation Age of Onset  . Diabetes Neg Hx   . Heart disease Neg Hx   . Hypertension Neg Hx   . Cancer Neg Hx     Social History   Social History  . Marital status: Single    Spouse name: N/A  . Number of children: 0  . Years of education: N/A   Occupational History  . Diabled    Social History Main Topics  . Smoking status: Never Smoker  . Smokeless tobacco: Never Used  . Alcohol use No  . Drug use: No  . Sexual  activity: Not on file   Other Topics Concern  . Not on file   Social History Narrative       Review of Systems No recent weight---seems to be stable No skin breakdown or rashes    Objective:   Physical Exam  Constitutional: No distress.  Neck: No thyromegaly present.  Cardiovascular: Regular rhythm and normal heart sounds.  Exam reveals no gallop.   No murmur heard. Usual tachycardia  Pulmonary/Chest: Effort normal and breath sounds normal. No respiratory distress. He has no wheezes. He has no rales.  Lymphadenopathy:    He has no cervical adenopathy.  Neurological:  Markedly decreased tone No active movement from neck down  Psychiatric:  Mood is neutral Some psychomotor retardation that is disease related and unchanged          Assessment & Plan:

## 2015-11-01 NOTE — Assessment & Plan Note (Signed)
Still doing fine Hopes the old vent holds on for a while

## 2015-11-08 ENCOUNTER — Telehealth: Payer: Self-pay

## 2015-11-08 NOTE — Telephone Encounter (Signed)
I am not familiar with any of that. Is she sure that stuff didn't come from the pulmonologist?

## 2015-11-08 NOTE — Telephone Encounter (Signed)
Nadene Rubins from [facility] called for clarification after her recertification visit yesterday with the pt:  1. Inspitory Suction is not on policy for [employer]. It is being done at pt request as needed. If they are to continue, they need an order.  2. Coughlater settings are needed on 485. Needs pressure for coughlater, inspitory pressure and time, expitory pressure and time, lapse in between, and how many cycles?  She will do an addendum to have it signed once she has clarification on both items

## 2015-11-09 DIAGNOSIS — R338 Other retention of urine: Secondary | ICD-10-CM | POA: Diagnosis not present

## 2015-11-09 DIAGNOSIS — Z435 Encounter for attention to cystostomy: Secondary | ICD-10-CM | POA: Diagnosis not present

## 2015-11-09 DIAGNOSIS — Z930 Tracheostomy status: Secondary | ICD-10-CM

## 2015-11-09 DIAGNOSIS — Z430 Encounter for attention to tracheostomy: Secondary | ICD-10-CM | POA: Diagnosis not present

## 2015-11-09 DIAGNOSIS — J9690 Respiratory failure, unspecified, unspecified whether with hypoxia or hypercapnia: Secondary | ICD-10-CM | POA: Diagnosis not present

## 2015-11-09 NOTE — Telephone Encounter (Signed)
Spoke to [place]. She will contact the pulmonologist.

## 2015-11-22 DIAGNOSIS — R1084 Generalized abdominal pain: Secondary | ICD-10-CM | POA: Diagnosis not present

## 2015-11-22 DIAGNOSIS — K5900 Constipation, unspecified: Secondary | ICD-10-CM | POA: Diagnosis not present

## 2015-11-22 DIAGNOSIS — K580 Irritable bowel syndrome with diarrhea: Secondary | ICD-10-CM | POA: Diagnosis not present

## 2015-11-22 DIAGNOSIS — R1030 Lower abdominal pain, unspecified: Secondary | ICD-10-CM | POA: Diagnosis not present

## 2015-11-24 ENCOUNTER — Other Ambulatory Visit: Payer: Self-pay | Admitting: Gastroenterology

## 2015-11-24 DIAGNOSIS — K5900 Constipation, unspecified: Secondary | ICD-10-CM

## 2015-11-24 DIAGNOSIS — R1084 Generalized abdominal pain: Secondary | ICD-10-CM

## 2015-11-30 ENCOUNTER — Ambulatory Visit: Payer: Medicare Other

## 2015-12-07 ENCOUNTER — Ambulatory Visit
Admission: RE | Admit: 2015-12-07 | Discharge: 2015-12-07 | Disposition: A | Discharge status: Home / Self Care | Payer: Medicare Other | Source: Ambulatory Visit | Attending: Gastroenterology | Admitting: Gastroenterology

## 2015-12-07 DIAGNOSIS — R1084 Generalized abdominal pain: Secondary | ICD-10-CM | POA: Diagnosis not present

## 2015-12-07 DIAGNOSIS — R918 Other nonspecific abnormal finding of lung field: Secondary | ICD-10-CM | POA: Insufficient documentation

## 2015-12-07 DIAGNOSIS — K5900 Constipation, unspecified: Secondary | ICD-10-CM

## 2015-12-07 DIAGNOSIS — K8020 Calculus of gallbladder without cholecystitis without obstruction: Secondary | ICD-10-CM | POA: Diagnosis not present

## 2015-12-07 DIAGNOSIS — N200 Calculus of kidney: Secondary | ICD-10-CM | POA: Diagnosis not present

## 2015-12-07 DIAGNOSIS — J90 Pleural effusion, not elsewhere classified: Secondary | ICD-10-CM | POA: Diagnosis not present

## 2015-12-07 DIAGNOSIS — R109 Unspecified abdominal pain: Secondary | ICD-10-CM | POA: Diagnosis not present

## 2015-12-11 ENCOUNTER — Encounter: Admission: RE | Admit: 2015-12-11 | Payer: Medicare Other | Source: Ambulatory Visit

## 2015-12-20 ENCOUNTER — Other Ambulatory Visit: Payer: Self-pay | Admitting: Internal Medicine

## 2015-12-20 NOTE — Telephone Encounter (Signed)
Approved: #120 x 5 meds given by [facility] nurses

## 2015-12-20 NOTE — Telephone Encounter (Signed)
Last filled 11-18-15 #120 Last OV 11-01-15 No Future OV

## 2015-12-20 NOTE — Telephone Encounter (Signed)
Left refill on voice mail at pharmacy  

## 2015-12-25 ENCOUNTER — Other Ambulatory Visit: Payer: Self-pay | Admitting: Internal Medicine

## 2016-01-03 ENCOUNTER — Encounter: Payer: Self-pay | Admitting: Internal Medicine

## 2016-01-03 ENCOUNTER — Ambulatory Visit: Payer: Medicare Other | Admitting: Internal Medicine

## 2016-01-03 VITALS — BP 90/52 | HR 108 | Resp 16

## 2016-01-03 DIAGNOSIS — Z23 Encounter for immunization: Secondary | ICD-10-CM | POA: Diagnosis not present

## 2016-01-03 DIAGNOSIS — G8250 Quadriplegia, unspecified: Secondary | ICD-10-CM

## 2016-01-03 DIAGNOSIS — G710 Muscular dystrophy: Secondary | ICD-10-CM

## 2016-01-03 DIAGNOSIS — I429 Cardiomyopathy, unspecified: Secondary | ICD-10-CM

## 2016-01-03 DIAGNOSIS — I43 Cardiomyopathy in diseases classified elsewhere: Secondary | ICD-10-CM

## 2016-01-03 DIAGNOSIS — F39 Unspecified mood [affective] disorder: Secondary | ICD-10-CM

## 2016-01-03 DIAGNOSIS — K3184 Gastroparesis: Secondary | ICD-10-CM

## 2016-01-03 DIAGNOSIS — G7101 Duchenne or Becker muscular dystrophy: Secondary | ICD-10-CM

## 2016-01-03 DIAGNOSIS — Z930 Tracheostomy status: Secondary | ICD-10-CM

## 2016-01-03 NOTE — Assessment & Plan Note (Signed)
Still fatigued but not depressed, per se Continue current duloxetine

## 2016-01-03 NOTE — Addendum Note (Signed)
Addended by: Pilar Grammes on: 01/03/2016 03:46 PM   Modules accepted: Orders

## 2016-01-03 NOTE — Assessment & Plan Note (Signed)
Seems to have had some response to the oxybutynin  Will continue the lower dose (2.$RemoveBefor'5mg'[id number]$  bid)

## 2016-01-03 NOTE — Assessment & Plan Note (Signed)
Had mass of dried pus on trach upon changing this time---but stoma looks okay and not suctioning pus Observe only---if that recurs, may want to change trach more frequently

## 2016-01-03 NOTE — Progress Notes (Signed)
Subjective:    Patient ID: [name], male    DOB: Aug 06, 1980, 35 y.o.   MRN: [id number]  HPI Home visit for review of chronic health conditions Greg Flow RN is here  Was started on oxybutynin $RemoveBefor'5mg'fnITdmEOlqQw$  tid --in hopes that it would help his bowel Caused dizziness--so now taking only 1/2 tab tid Seems to maybe help some No increased constipation on this  Has had to change to low fat diet Better tolerated Ultrasound did show some gallstones--unclear if that is related to his symptoms  Old vent is holding up Breathing is okay Did have apparent trach stoma infection---changed yesterday and there was some purulent discharge with some blood Not suctioning any purulent material --- dried thick material on old trach (but may have just been stuck there)  Still up once a day--- 1-2 meals while up Weight seems stable  Still has trouble getting up out of bed Feels so tired Not really getting out Just uses the alprazolam at night Tramadol -- 3 in evening still (for foot pain) He thinks the duloxetine is helping some---doesn't feel depression is the cause of his tiredness, etc  Current Outpatient Prescriptions on File Prior to Visit  Medication Sig Dispense Refill  . acetaminophen (TYLENOL) 500 MG tablet Take 500 mg by mouth every 6 (six) hours as needed.      . ALPRAZolam (XANAX) 0.5 MG tablet TAKE 1 TABLET BY MOUTH 3 TIMES A DAY AS NEEDED 90 tablet 2  . DULoxetine (CYMBALTA) 30 MG capsule TAKE 1 CAPSULE (30 MG TOTAL) BY MOUTH DAILY. 30 capsule 10  . lidocaine (LIDODERM) 5 % Place 1 patch onto the skin daily as needed.    Marland Kitchen LINZESS 145 MCG CAPS capsule TAKE 1-2 CAPSULES (145-290 MCG TOTAL) BY MOUTH DAILY. 60 capsule 11  . Multiple Vitamins-Minerals (ONE-A-DAY VITACRAVES IMMUNITY) CHEW Chew 1 tablet by mouth daily.    . Naproxen Sodium 220 MG CAPS Take 220 mg by mouth 2 (two) times daily as needed.     . phenazopyridine (PYRIDIUM) 100 MG tablet Take 1 tablet (100 mg total) by mouth 3  (three) times daily with meals. 90 tablet 11  . polyethylene glycol (MIRALAX / GLYCOLAX) packet Take 17 g by mouth daily as needed.     . simethicone (MYLICON) 80 MG chewable tablet Chew 320 mg by mouth 6 (six) times daily.    . traMADol (ULTRAM) 50 MG tablet TAKE 1 TO 2 TABLETS BY MOUTH EVERY 4 HOURS AS NEEDED AND 2 TABLETS BY MOUTH AT BEDTIME 120 tablet 0   No current facility-administered medications on file prior to visit.     Allergies  Allergen Reactions  . Cephalexin     REACTION: GI distress  . Penicillins     REACTION: rash only  . Pregabalin     REACTION: urinary retention    Past Medical History:  Diagnosis Date  . 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 IBS (irritable bowel syndrome)   . Neuropathy [facility])     Past Surgical History:  Procedure Laterality Date  . Abdominal distention  4/05   Laparotomy negative  . Pneumonia  6/02   Resp failure--J tube for a while then removed  . Pneumonia  11/06  . Scoliosis repair  ~1991   Thoracic--with Harrington rods  . SUPRAPUBIC CATHETER INSERTION  5/14   [facility]  . SUPRAPUBIC CATHETER INSERTION  5/14    Family History  Problem Relation Age of Onset  . Diabetes  Neg Hx   . Heart disease Neg Hx   . Hypertension Neg Hx   . Cancer Neg Hx     Social History   Social History  . Marital status: Single    Spouse name: N/A  . Number of children: 0  . Years of education: N/A   Occupational History  . Diabled    Social History Main Topics  . Smoking status: Never Smoker  . Smokeless tobacco: Never Used  . Alcohol use No  . Drug use: No  . Sexual activity: Not on file   Other Topics Concern  . Not on file   Social History Narrative       Review of Systems Still has trouble sleeping-- awake still till ~4AM Stays in bed till mid afternoon mostly Still happy with the suprapubic catheter    Objective:   Physical Exam  Constitutional: No distress.  Alert in wheelchair--normal  interaction  Cardiovascular: Regular rhythm and normal heart sounds.  Exam reveals no gallop.   No murmur heard. Usual mildly tachycardic  Pulmonary/Chest: Effort normal and breath sounds normal. No respiratory distress. He has no wheezes. He has no rales.  Abdominal:  Scaphoid No tenderness  Musculoskeletal: He exhibits no edema.  Neurological:  No muscle tone  Psychiatric:  Mood is neutral Normal engagement          Assessment & Plan:

## 2016-01-03 NOTE — Assessment & Plan Note (Signed)
End stage but seems reasonably stable Total care from nurses and family

## 2016-01-03 NOTE — Assessment & Plan Note (Signed)
Usual tachycardia but no other symptoms Will see cardiologist soon

## 2016-01-03 NOTE — Assessment & Plan Note (Signed)
Mostly stays in bed In chair once a day for a few hours Increasing fatigue--discussed trying to decrease alprazolam and tramadol to see if that makes any difference

## 2016-01-11 ENCOUNTER — Telehealth: Payer: Self-pay | Admitting: *Deleted

## 2016-01-11 DIAGNOSIS — G710 Muscular dystrophy: Secondary | ICD-10-CM | POA: Diagnosis not present

## 2016-01-11 DIAGNOSIS — I428 Other cardiomyopathies: Secondary | ICD-10-CM | POA: Diagnosis not present

## 2016-01-11 NOTE — Telephone Encounter (Signed)
Form done No charge 

## 2016-01-11 NOTE — Telephone Encounter (Signed)
Opened in error

## 2016-01-11 NOTE — Telephone Encounter (Signed)
PT's father brought in a form to be completed. Please complete it and mail it in the addressed envelope that is included. Form placed in prescription tower.

## 2016-01-11 NOTE — Telephone Encounter (Signed)
Form on Dr Alla German Desk in the InBox

## 2016-01-18 DIAGNOSIS — J9690 Respiratory failure, unspecified, unspecified whether with hypoxia or hypercapnia: Secondary | ICD-10-CM | POA: Diagnosis not present

## 2016-01-18 DIAGNOSIS — Z930 Tracheostomy status: Secondary | ICD-10-CM | POA: Diagnosis not present

## 2016-01-18 DIAGNOSIS — R338 Other retention of urine: Secondary | ICD-10-CM | POA: Diagnosis not present

## 2016-01-18 DIAGNOSIS — Z430 Encounter for attention to tracheostomy: Secondary | ICD-10-CM

## 2016-01-18 DIAGNOSIS — Z435 Encounter for attention to cystostomy: Secondary | ICD-10-CM | POA: Diagnosis not present

## 2016-02-12 DIAGNOSIS — R1030 Lower abdominal pain, unspecified: Secondary | ICD-10-CM | POA: Diagnosis not present

## 2016-03-04 DIAGNOSIS — R338 Other retention of urine: Secondary | ICD-10-CM | POA: Diagnosis not present

## 2016-03-04 DIAGNOSIS — J9690 Respiratory failure, unspecified, unspecified whether with hypoxia or hypercapnia: Secondary | ICD-10-CM | POA: Diagnosis not present

## 2016-03-04 DIAGNOSIS — Z730 Burn-out: Secondary | ICD-10-CM

## 2016-03-04 DIAGNOSIS — Z930 Tracheostomy status: Secondary | ICD-10-CM | POA: Diagnosis not present

## 2016-03-04 DIAGNOSIS — Z435 Encounter for attention to cystostomy: Secondary | ICD-10-CM | POA: Diagnosis not present

## 2016-03-19 ENCOUNTER — Telehealth: Payer: Self-pay

## 2016-03-19 NOTE — Telephone Encounter (Signed)
Removed Lidocaine patch and Miralax from his med list per recent fax we received from [facility]

## 2016-03-27 ENCOUNTER — Encounter: Payer: Self-pay | Admitting: Internal Medicine

## 2016-03-27 ENCOUNTER — Ambulatory Visit: Payer: Medicare Other | Admitting: Internal Medicine

## 2016-03-27 VITALS — BP 80/50 | HR 122 | Resp 16

## 2016-03-27 DIAGNOSIS — G8250 Quadriplegia, unspecified: Secondary | ICD-10-CM

## 2016-03-27 DIAGNOSIS — J9612 Chronic respiratory failure with hypercapnia: Secondary | ICD-10-CM

## 2016-03-27 DIAGNOSIS — G629 Polyneuropathy, unspecified: Secondary | ICD-10-CM | POA: Diagnosis not present

## 2016-03-27 DIAGNOSIS — J9610 Chronic respiratory failure, unspecified whether with hypoxia or hypercapnia: Secondary | ICD-10-CM | POA: Insufficient documentation

## 2016-03-27 DIAGNOSIS — G710 Muscular dystrophy: Secondary | ICD-10-CM | POA: Diagnosis not present

## 2016-03-27 DIAGNOSIS — F39 Unspecified mood [affective] disorder: Secondary | ICD-10-CM | POA: Diagnosis not present

## 2016-03-27 DIAGNOSIS — K3184 Gastroparesis: Secondary | ICD-10-CM

## 2016-03-27 DIAGNOSIS — G7101 Duchenne or Becker muscular dystrophy: Secondary | ICD-10-CM

## 2016-03-27 DIAGNOSIS — J9611 Chronic respiratory failure with hypoxia: Secondary | ICD-10-CM

## 2016-03-27 MED ORDER — VITAMIN B-12 500 MCG SL SUBL: 500.0000 ug | SUBLINGUAL_TABLET | Freq: Every day | SUBLINGUAL | 0 refills | Status: AC

## 2016-03-27 MED ORDER — DULOXETINE HCL 60 MG PO CPEP: 60.0000 mg | ORAL_CAPSULE | Freq: Every day | ORAL | 11 refills | Status: DC

## 2016-03-27 MED ORDER — ALPRAZOLAM 0.5 MG PO TABS: ORAL_TABLET | ORAL | 2 refills | Status: AC

## 2016-03-27 NOTE — Assessment & Plan Note (Signed)
End stage in all organ systems--GI, cardiac, skeletal

## 2016-03-27 NOTE — Assessment & Plan Note (Signed)
Vent dependent Has had some desaturations measured by the nurses Order written for portable oxygen for when he travels out of the house

## 2016-03-27 NOTE — Assessment & Plan Note (Signed)
Worsening abdominal pain May have gallbladder issues as well--but not clear cut Discussed multiple options about this---he is vacillating

## 2016-03-27 NOTE — Assessment & Plan Note (Signed)
Will try the higher dose of duloxetine for this also

## 2016-03-27 NOTE — Assessment & Plan Note (Signed)
Really suffering Not sleeping well Will try increasing the duloxetine to $RemoveBefor'60mg'CsWmisgEaztg$  daily Using tramadol Asked them to change to sublingual B12

## 2016-03-27 NOTE — Progress Notes (Signed)
Subjective:    Patient ID: [name], male    DOB: 05-28-1980, 34 y.o.   MRN: [id number]  HPI Home visit for review of multiple medical problems Dad is here  [name] RN is here  Ongoing issues with the neuropathic pain He uses up to 4 tramadol at night for this Still has trouble sleeping--- up most of the night in the last few days May even go back for a month or more Does take vitamin B12---discussed trying sublingual instead  Also having more abdominal pain This is mostly after eating--will bloat even if he just drinks water Still tries to eat 2 meals when up in wheelchair--but often only 1 meal Snack in bed in late evening Hasn't done well with liquid nutrition supplements  Continues to need the linzess Has not tolerated skipping it Bowels have been fairly regular Has had severe distention at times--not predictable (but not clearly post prandial). He feels this has been gas (seemed to improve after passing gas) He generally does feel worse briefly after passing a large stool  Reviewed his recent surgeon visit Wasn't convinced the gallbladder was the cause of his pain--and that surgery would be very risky  Especially distressed about the sleep Tried melatonin in the past ambien caused hallucinations Trazodone, restoril and lunesta not helpful Current Outpatient Prescriptions on File Prior to Visit  Medication Sig Dispense Refill  . acetaminophen (TYLENOL) 500 MG tablet Take 500 mg by mouth every 6 (six) hours as needed.      . ALPRAZolam (XANAX) 0.5 MG tablet TAKE 1 TABLET BY MOUTH 3 TIMES A DAY AS NEEDED (Patient taking differently: TAKE 1 TABLET BY MOUTH NIGHTLY AND 3 TIMES A DAY AS NEEDED) 90 tablet 2  . DULoxetine (CYMBALTA) 30 MG capsule TAKE 1 CAPSULE (30 MG TOTAL) BY MOUTH DAILY. 30 capsule 10  . LINZESS 145 MCG CAPS capsule TAKE 1-2 CAPSULES (145-290 MCG TOTAL) BY MOUTH DAILY. (Patient taking differently: TAKE 2 CAPSULES (145-290 MCG TOTAL) BY MOUTH DAILY.) 60  capsule 11  . Multiple Vitamins-Minerals (ONE-A-DAY VITACRAVES IMMUNITY) CHEW Chew 1 tablet by mouth daily.    . Naproxen Sodium 220 MG CAPS Take 220 mg by mouth 2 (two) times daily as needed.     Marland Kitchen oxybutynin (DITROPAN) 5 MG tablet Take 2.5 mg by mouth 2 (two) times daily.    . phenazopyridine (PYRIDIUM) 100 MG tablet Take 1 tablet (100 mg total) by mouth 3 (three) times daily with meals. 90 tablet 11  . simethicone (MYLICON) 80 MG chewable tablet Chew 320 mg by mouth 6 (six) times daily.    . traMADol (ULTRAM) 50 MG tablet TAKE 1 TO 2 TABLETS BY MOUTH EVERY 4 HOURS AS NEEDED AND 2 TABLETS BY MOUTH AT BEDTIME 120 tablet 0   No current facility-administered medications on file prior to visit.     Allergies  Allergen Reactions  . Cephalexin     REACTION: GI distress  . Penicillins     REACTION: rash only  . Pregabalin     REACTION: urinary retention    Past Medical History:  Diagnosis Date  . 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 IBS (irritable bowel syndrome)   . Neuropathy [facility])     Past Surgical History:  Procedure Laterality Date  . Abdominal distention  4/05   Laparotomy negative  . Pneumonia  6/02   Resp failure--J tube for a while then removed  . Pneumonia  11/06  . Scoliosis repair  ~  1991   Thoracic--with Harrington rods  . SUPRAPUBIC CATHETER INSERTION  5/14   [facility]  . SUPRAPUBIC CATHETER INSERTION  5/14    Family History  Problem Relation Age of Onset  . Diabetes Neg Hx   . Heart disease Neg Hx   . Hypertension Neg Hx   . Cancer Neg Hx     Social History   Social History  . Marital status: Single    Spouse name: N/A  . Number of children: 0  . Years of education: N/A   Occupational History  . Diabled    Social History Main Topics  . Smoking status: Never Smoker  . Smokeless tobacco: Never Used  . Alcohol use No  . Drug use: No  . Sexual activity: Not on file   Other Topics Concern  . Not on file   Social  History Narrative       Review of Systems Doesn't seem to have lost weight in the past year No skin breakdown No SOB Some trouble with suctioning-- simethicone has been found on top of trach and messing up his secretions Suctioned frequently on night shift-- 2-3 on day shift Still happy with the suprapubic catheter Has desaturated at times--will need portable oxygen    Objective:   Physical Exam  Constitutional: No distress.  Almost no muscle mass  Neck: No thyromegaly present.  Trach site clean  Cardiovascular: Regular rhythm.   tachycardic  Pulmonary/Chest:  Some rhonchi--seems bronchial  Abdominal: Soft. There is no tenderness.  Musculoskeletal: He exhibits no edema.  Lymphadenopathy:    He has no cervical adenopathy.  Neurological:  No tone          Assessment & Plan:

## 2016-03-27 NOTE — Assessment & Plan Note (Signed)
Bed/ motorized chair bound 24 hour care with [facility]

## 2016-03-29 ENCOUNTER — Telehealth: Payer: Self-pay

## 2016-03-29 NOTE — Telephone Encounter (Signed)
Pt's father left a message on Triage VM asking to talk to Dr Silvio Pate about the Morphine Rx that was discussed at his recent home visit. Would like a call back at [phone]

## 2016-03-30 NOTE — Telephone Encounter (Signed)
Discussed with him [name] is ready to try this if he is having a bad night  I will write Rx so they can have it available

## 2016-04-02 MED ORDER — MORPHINE SULFATE (CONCENTRATE) 10 MG /0.5 ML PO SOLN: 5.0000 mg | ORAL | 0 refills | Status: AC | PRN

## 2016-04-02 NOTE — Telephone Encounter (Signed)
Left message on VM for [name] that rx is up front ready for pickup

## 2016-04-02 NOTE — Telephone Encounter (Signed)
Please let dad know the prescription is ready for pick up

## 2016-04-05 ENCOUNTER — Telehealth: Payer: Self-pay

## 2016-04-05 NOTE — Telephone Encounter (Signed)
Spoke to [employer]. She received his order for Oxygen and asked if we had his O2 sats. I advised her it did not look like we did. She will call Alvis Lemmings and see if the RN can do it.

## 2016-04-05 NOTE — Telephone Encounter (Signed)
[name] from [facility] left a voice mail has questions about an oxygen order.

## 2016-04-15 ENCOUNTER — Telehealth: Payer: Self-pay | Admitting: Internal Medicine

## 2016-04-15 NOTE — Telephone Encounter (Signed)
I have not seen any forms for him. They are not any in Dr Alla German Inbox on his desk, either.   I called and spoke to [facility]. She said she has faxed the O2 recert form back with a clean one. Said the answers we put were different than they were in the past. She included a clean form to be filled out like the original. It should be coming soon.  Usually, faxes like this go straight to Dr Silvio Pate. Forwarding to him so he will know to be expecting it.

## 2016-04-15 NOTE — Telephone Encounter (Signed)
I think I did an addendum on the one I had already done. Might have been sent back---I forgot length of need and diagnosis They also stated the certifying O2 was different (or different date)--they should let me know which one they want me to use

## 2016-04-15 NOTE — Telephone Encounter (Signed)
[facility] called -  The form they faxed to you needs to be filled out with initial testing results and should be attached to the first copy.   cb number is [phone]

## 2016-04-24 ENCOUNTER — Telehealth: Payer: Self-pay | Admitting: Internal Medicine

## 2016-04-24 NOTE — Telephone Encounter (Signed)
larna brought certificate of medical necessity form to be changed.  The highlighted area needs to be answered #3 instead of #1.  Please changed initial and date and fax back to [phone]  Also attached is results from [facility] if you need them  Placed in Dr Everardo Beals IN BOX

## 2016-04-24 NOTE — Telephone Encounter (Signed)
From addended

## 2016-04-24 NOTE — Telephone Encounter (Signed)
[name] did you get this form

## 2016-04-24 NOTE — Telephone Encounter (Signed)
Paperwork faxed °

## 2016-05-17 DIAGNOSIS — Z435 Encounter for attention to cystostomy: Secondary | ICD-10-CM | POA: Diagnosis not present

## 2016-05-17 DIAGNOSIS — R338 Other retention of urine: Secondary | ICD-10-CM | POA: Diagnosis not present

## 2016-05-17 DIAGNOSIS — J9690 Respiratory failure, unspecified, unspecified whether with hypoxia or hypercapnia: Secondary | ICD-10-CM | POA: Diagnosis not present

## 2016-05-17 DIAGNOSIS — Z430 Encounter for attention to tracheostomy: Secondary | ICD-10-CM | POA: Diagnosis not present

## 2016-05-17 DIAGNOSIS — Z930 Tracheostomy status: Secondary | ICD-10-CM | POA: Diagnosis not present

## 2016-05-22 DIAGNOSIS — G5791 Unspecified mononeuropathy of right lower limb: Secondary | ICD-10-CM | POA: Diagnosis not present

## 2016-05-22 DIAGNOSIS — R1084 Generalized abdominal pain: Secondary | ICD-10-CM | POA: Diagnosis not present

## 2016-05-22 DIAGNOSIS — G894 Chronic pain syndrome: Secondary | ICD-10-CM | POA: Diagnosis not present

## 2016-05-22 DIAGNOSIS — M792 Neuralgia and neuritis, unspecified: Secondary | ICD-10-CM | POA: Diagnosis not present

## 2016-05-22 DIAGNOSIS — R1030 Lower abdominal pain, unspecified: Secondary | ICD-10-CM | POA: Diagnosis not present

## 2016-05-29 ENCOUNTER — Encounter: Payer: Self-pay | Admitting: Internal Medicine

## 2016-05-29 ENCOUNTER — Ambulatory Visit: Payer: Medicare Other | Admitting: Internal Medicine

## 2016-05-29 VITALS — BP 90/50 | HR 96 | Temp 98.2°F | Resp 16 | Wt <= 1120 oz

## 2016-05-29 DIAGNOSIS — Z9359 Other cystostomy status: Secondary | ICD-10-CM | POA: Diagnosis not present

## 2016-05-29 DIAGNOSIS — Z930 Tracheostomy status: Secondary | ICD-10-CM

## 2016-05-29 DIAGNOSIS — K3184 Gastroparesis: Secondary | ICD-10-CM | POA: Diagnosis not present

## 2016-05-29 DIAGNOSIS — G8250 Quadriplegia, unspecified: Secondary | ICD-10-CM

## 2016-05-29 DIAGNOSIS — J9611 Chronic respiratory failure with hypoxia: Secondary | ICD-10-CM

## 2016-05-29 DIAGNOSIS — F39 Unspecified mood [affective] disorder: Secondary | ICD-10-CM

## 2016-05-29 DIAGNOSIS — J9612 Chronic respiratory failure with hypercapnia: Secondary | ICD-10-CM

## 2016-05-29 DIAGNOSIS — G7101 Duchenne or Becker muscular dystrophy: Secondary | ICD-10-CM

## 2016-05-29 DIAGNOSIS — L89322 Pressure ulcer of left buttock, stage 2: Secondary | ICD-10-CM | POA: Insufficient documentation

## 2016-05-29 DIAGNOSIS — I429 Cardiomyopathy, unspecified: Secondary | ICD-10-CM | POA: Diagnosis not present

## 2016-05-29 DIAGNOSIS — L89151 Pressure ulcer of sacral region, stage 1: Secondary | ICD-10-CM

## 2016-05-29 DIAGNOSIS — G710 Muscular dystrophy: Secondary | ICD-10-CM

## 2016-05-29 DIAGNOSIS — I43 Cardiomyopathy in diseases classified elsewhere: Secondary | ICD-10-CM

## 2016-05-29 DIAGNOSIS — L98429 Non-pressure chronic ulcer of back with unspecified severity: Secondary | ICD-10-CM

## 2016-05-29 NOTE — Assessment & Plan Note (Signed)
Likely the reason for his chronic--largely postprandial-pain. Multiple regimens not very helpful--but linzess does help bowels

## 2016-05-29 NOTE — Assessment & Plan Note (Signed)
End stage in all muscle groups---cardiac, GI ,skeletal

## 2016-05-29 NOTE — Assessment & Plan Note (Signed)
Orders given for care--[facility] for protection and neosporin if opens

## 2016-05-29 NOTE — Progress Notes (Signed)
Subjective:    Patient ID: [name], male    DOB: Jun 18, 1980, 36 y.o.   MRN: [id number]  HPI Home visit for follow up of chronic health conditions [facility] nurse [name] here as usual  New pressure area on coccyx Across sacrum No ulcer--just red Generally using telfa unless it opens--then would use neosporin  Sleeping some better Variable depending on the day Never tried the roxanol--pain not severe enough for him to want this  We did consider increase the duloxetine last time Afraid to do this and decrease the tramadol Pain doctors suggested moving first of 3 nighttime tramadol before main meal (in chair) but he hasn't tried this yet either  Mood is better--despite not increasing the duloxetine Likely because of better sleep Uses the alprazolam at bedtime also Initiates sleep around 4AM and is in bed most days till as late as 4PM Only tolerating about 2 hours in chair   Abdominal pain persists---ongoing biggest problem Bowels still slow but satisfied with the linzess  No chest pain No SOB No palpitations  Current Outpatient Prescriptions on File Prior to Visit  Medication Sig Dispense Refill  . acetaminophen (TYLENOL) 500 MG tablet Take 500 mg by mouth every 6 (six) hours as needed.      . ALPRAZolam (XANAX) 0.5 MG tablet TAKE 1 TABLET BY MOUTH NIGHTLY AND 3 TIMES A DAY AS NEEDED 90 tablet 2  . Cyanocobalamin (VITAMIN B-12) 500 MCG SUBL Place 1 tablet (500 mcg total) under the tongue daily. 1 tablet 0  . LINZESS 145 MCG CAPS capsule TAKE 1-2 CAPSULES (145-290 MCG TOTAL) BY MOUTH DAILY. (Patient taking differently: TAKE 2 CAPSULES (145-290 MCG TOTAL) BY MOUTH DAILY.) 60 capsule 11  . Morphine Sulfate (MORPHINE CONCENTRATE) 10 mg / 0.5 ml concentrated solution Take 0.25 mLs (5 mg total) by mouth every 2 (two) hours as needed for severe pain. 30 mL 0  . Multiple Vitamins-Minerals (ONE-A-DAY VITACRAVES IMMUNITY) CHEW Chew 1 tablet by mouth daily.    . Naproxen Sodium 220  MG CAPS Take 220 mg by mouth 2 (two) times daily as needed.     Marland Kitchen oxybutynin (DITROPAN) 5 MG tablet Take 2.5 mg by mouth 2 (two) times daily.    . phenazopyridine (PYRIDIUM) 100 MG tablet Take 1 tablet (100 mg total) by mouth 3 (three) times daily with meals. 90 tablet 11  . simethicone (MYLICON) 80 MG chewable tablet Chew 320 mg by mouth 6 (six) times daily.    . traMADol (ULTRAM) 50 MG tablet TAKE 1 TO 2 TABLETS BY MOUTH EVERY 4 HOURS AS NEEDED AND 2 TABLETS BY MOUTH AT BEDTIME 120 tablet 0   No current facility-administered medications on file prior to visit.     Allergies  Allergen Reactions  . Cephalexin     REACTION: GI distress  . Penicillins     REACTION: rash only  . Pregabalin     REACTION: urinary retention    Past Medical History:  Diagnosis Date  . 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 IBS (irritable bowel syndrome)   . Neuropathy [facility])     Past Surgical History:  Procedure Laterality Date  . Abdominal distention  4/05   Laparotomy negative  . Pneumonia  6/02   Resp failure--J tube for a while then removed  . Pneumonia  11/06  . Scoliosis repair  ~1991   Thoracic--with Harrington rods  . SUPRAPUBIC CATHETER INSERTION  5/14   [facility]  . SUPRAPUBIC CATHETER  INSERTION  5/14    Family History  Problem Relation Age of Onset  . Diabetes Neg Hx   . Heart disease Neg Hx   . Hypertension Neg Hx   . Cancer Neg Hx     Social History   Social History  . Marital status: Single    Spouse name: N/A  . Number of children: 0  . Years of education: N/A   Occupational History  . Diabled    Social History Main Topics  . Smoking status: Never Smoker  . Smokeless tobacco: Never Used  . Alcohol use No  . Drug use: No  . Sexual activity: Not on file   Other Topics Concern  . Not on file   Social History Narrative       Review of Systems 1 main meal in chair and 1 snack in bed Weight down a little--at doctor's office Still  satisfied with the suprapubic Has abnormal area over left 1st MTP--- no tenderness and not swollen (just slightly thinned and hyperpigmented skin)    Objective:   Physical Exam  Constitutional:  Up in motorized chair  Neck:  Trach site clean  Cardiovascular: Normal rate, regular rhythm and normal heart sounds.  Exam reveals no gallop.   No murmur heard. Pulmonary/Chest: Effort normal and breath sounds normal. No respiratory distress. He has no wheezes. He has no rales.  Abdominal: Soft. There is no tenderness.  Musculoskeletal:  Severe and total muscle wasting  Neurological:  Markedly reduced muscle tone  Psychiatric:  More calm and at ease (probably due to better sleep)          Assessment & Plan:

## 2016-05-29 NOTE — Assessment & Plan Note (Signed)
Is getting suctioned more--but no signs of tracheitis

## 2016-05-29 NOTE — Assessment & Plan Note (Signed)
Still satisfied with this

## 2016-05-29 NOTE — Assessment & Plan Note (Signed)
No symptoms Still often tachycardic

## 2016-05-29 NOTE — Assessment & Plan Note (Signed)
Doing okay on the current vent settings

## 2016-05-29 NOTE — Assessment & Plan Note (Signed)
Seems better without increasing the duloxetine Probably due to better sleep

## 2016-05-29 NOTE — Assessment & Plan Note (Signed)
Now only up in chair for about 2 hours a day---fatigues otherwise Total care with nurses from [facility]

## 2016-07-01 ENCOUNTER — Telehealth: Payer: Self-pay | Admitting: *Deleted

## 2016-07-01 NOTE — Telephone Encounter (Signed)
He will text me a picture and let me know what the [facility] supervisor thinks (who is going out to check it later this afternoon) I will go out this week if needed

## 2016-07-01 NOTE — Telephone Encounter (Signed)
Spoke to pts father who states Dr Silvio Pate does home visits for the pt.  pt has a bed sore appx the size of a quarter that is "oozing," and father wanting to advise Dr Silvio Pate so that he can make arrangements to see pt, or if he is needing additional help or a referral to wound care

## 2016-07-10 ENCOUNTER — Telehealth: Payer: Self-pay

## 2016-07-10 ENCOUNTER — Other Ambulatory Visit: Payer: Self-pay | Admitting: Internal Medicine

## 2016-07-10 NOTE — Telephone Encounter (Signed)
[name] said that Greg [name] are wondering if pt should have urinalysis done. On 07/09/16 pt had memory loss but no fever. Pt is still asleep today. [name] request cb on 07/11/16. [facility]. Pt last seen home visit on 05/29/16.

## 2016-07-11 NOTE — Telephone Encounter (Signed)
Last filled 05-29-16 #120 Last OV 05-29-16

## 2016-07-11 NOTE — Telephone Encounter (Signed)
Left refill on voice mail at pharmacy  

## 2016-07-11 NOTE — Telephone Encounter (Signed)
Approved: #120 x 1

## 2016-07-11 NOTE — Telephone Encounter (Signed)
Memory loss is very non specific. He has a suprapubic catheter so his urine will always have something in it. If he has different abdominal pain (than his usual), fever or burning in his bladder (or hematuria), they should send a urinalysis and culture

## 2016-07-11 NOTE — Telephone Encounter (Signed)
Left detailed message for [name].

## 2016-07-16 DIAGNOSIS — Z430 Encounter for attention to tracheostomy: Secondary | ICD-10-CM | POA: Diagnosis not present

## 2016-07-16 DIAGNOSIS — R338 Other retention of urine: Secondary | ICD-10-CM | POA: Diagnosis not present

## 2016-07-16 DIAGNOSIS — Z435 Encounter for attention to cystostomy: Secondary | ICD-10-CM | POA: Diagnosis not present

## 2016-07-16 DIAGNOSIS — Z930 Tracheostomy status: Secondary | ICD-10-CM | POA: Diagnosis not present

## 2016-07-18 ENCOUNTER — Telehealth: Payer: Self-pay

## 2016-07-18 NOTE — Telephone Encounter (Signed)
Katharine Look nurse with [facility] left v/m; pt continues for 1 week with elevated heart rate between 118-136; no fever. Parents are aware; thinks may have UTI but client will probably not take abx. Pt did not want Katharine Look to notify Dr Silvio Pate about this call. Pt is more confused while sleeping; calling out when sleeping which is not pts normal. [name] pts father agreed pt will probably not take abx. Katharine Look wanted FYI to Dr Silvio Pate.

## 2016-07-18 NOTE — Telephone Encounter (Signed)
Noted Not sure about UTI--would be hard to tell. He has bad cardiomyopathy so could be primary cardiac issue (and there is no Rx for this)

## 2016-07-24 NOTE — Telephone Encounter (Signed)
[name] pts father left v/m;pt continuing with wound that is not healing; the nurses are telling pts father that wound is not healing, slightly larger in size and oozing clear liquid; nurses are hoping for different way to address the wound care [name] request cb.

## 2016-07-25 NOTE — Telephone Encounter (Signed)
Spoke to his father I will make a home visit next Wednesday to look at it and decide if we need to do anything different. (2:30PM) Now getting neosporin and dressing

## 2016-07-31 ENCOUNTER — Ambulatory Visit: Payer: Medicare Other | Admitting: Internal Medicine

## 2016-07-31 ENCOUNTER — Encounter: Payer: Self-pay | Admitting: Internal Medicine

## 2016-07-31 DIAGNOSIS — E440 Moderate protein-calorie malnutrition: Secondary | ICD-10-CM

## 2016-07-31 DIAGNOSIS — I429 Cardiomyopathy, unspecified: Secondary | ICD-10-CM | POA: Diagnosis not present

## 2016-07-31 DIAGNOSIS — L89322 Pressure ulcer of left buttock, stage 2: Secondary | ICD-10-CM

## 2016-07-31 DIAGNOSIS — G710 Muscular dystrophy: Secondary | ICD-10-CM | POA: Diagnosis not present

## 2016-07-31 DIAGNOSIS — G8250 Quadriplegia, unspecified: Secondary | ICD-10-CM

## 2016-07-31 DIAGNOSIS — G7101 Duchenne or Becker muscular dystrophy: Secondary | ICD-10-CM

## 2016-07-31 DIAGNOSIS — I43 Cardiomyopathy in diseases classified elsewhere: Secondary | ICD-10-CM

## 2016-07-31 DIAGNOSIS — G629 Polyneuropathy, unspecified: Secondary | ICD-10-CM

## 2016-07-31 MED ORDER — TIZANIDINE HCL 2 MG PO TABS: 2.0000 mg | ORAL_TABLET | Freq: Three times a day (TID) | ORAL | 0 refills | Status: AC | PRN

## 2016-07-31 NOTE — Assessment & Plan Note (Signed)
Bed bound and up briefly daily Total care [facility] nurses 24/7

## 2016-07-31 NOTE — Progress Notes (Signed)
Subjective:    Patient ID: [name], male    DOB: 11/18/1980, 36 y.o.   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 Purplish but not apparently infected Padded with telfa---in bed now so I can assess today He feels it a little but no sig pain----and he prefers lying on the side where the ulcer is  Still just up in wheelchair for 1.5 hours at most Has 1 meal while up---just a snack later in evening (long time pattern) Same stomach symptoms On linzess daily--generally 2 Bowels have been okay Hasn't yet tried the morphine  Still on the oxybutynin From GI for spasm--seems to help Discussed that this can cause constipation as well Has cut back on the simethicone considerably---no clear loss in symptoms with this  No problems with trach Same suctioning needs No SOB Changed once a month  On oxygen for comfort Will drop to 91% or so off the oxygen---but feels uncomfortable with this  Current Outpatient Prescriptions on File Prior to Visit  Medication Sig Dispense Refill  . acetaminophen (TYLENOL) 500 MG tablet Take 500 mg by mouth every 6 (six) hours as needed.      . ALPRAZolam (XANAX) 0.5 MG tablet TAKE 1 TABLET BY MOUTH NIGHTLY AND 3 TIMES A DAY AS NEEDED 90 tablet 2  . Cyanocobalamin (VITAMIN B-12) 500 MCG SUBL Place 1 tablet (500 mcg total) under the tongue daily. 1 tablet 0  . DULoxetine (CYMBALTA) 30 MG capsule Take 30 mg by mouth daily.    Marland Kitchen LINZESS 145 MCG CAPS capsule TAKE 1-2 CAPSULES (145-290 MCG TOTAL) BY MOUTH DAILY. (Patient taking differently: TAKE 2 CAPSULES (145-290 MCG TOTAL) BY MOUTH DAILY.) 60 capsule 11  . Morphine Sulfate (MORPHINE CONCENTRATE) 10 mg / 0.5 ml concentrated solution Take 0.25 mLs (5 mg total) by mouth every 2 (two) hours as needed for severe pain. 30 mL 0  .  Multiple Vitamins-Minerals (ONE-A-DAY VITACRAVES IMMUNITY) CHEW Chew 1 tablet by mouth daily.    . Naproxen Sodium 220 MG CAPS Take 220 mg by mouth 2 (two) times daily as needed.     Marland Kitchen oxybutynin (DITROPAN) 5 MG tablet Take 2.5 mg by mouth 2 (two) times daily.    . phenazopyridine (PYRIDIUM) 100 MG tablet Take 1 tablet (100 mg total) by mouth 3 (three) times daily with meals. 90 tablet 11  . simethicone (MYLICON) 80 MG chewable tablet Chew 320 mg by mouth 6 (six) times daily.    . traMADol (ULTRAM) 50 MG tablet TAKE 1 TO 2 TABLETS BY MOUTH EVERY 4 HOURS AS NEEDED AND 2 TABLETS AT BEDTIME**MAX 8 PER DAY INS** 120 tablet 1   No current facility-administered medications on file prior to visit.     Allergies  Allergen Reactions  . Cephalexin     REACTION: GI distress  . Penicillins     REACTION: rash only  . Pregabalin     REACTION: urinary retention    Past Medical History:  Diagnosis Date  . 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 IBS (irritable bowel syndrome)   . Neuropathy [facility])     Past Surgical History:  Procedure Laterality Date  . Abdominal distention  4/05   Laparotomy negative  . Pneumonia  6/02   Resp failure--J tube for a  while then removed  . Pneumonia  11/06  . Scoliosis repair  ~1991   Thoracic--with Harrington rods  . SUPRAPUBIC CATHETER INSERTION  5/14   [facility]  . SUPRAPUBIC CATHETER INSERTION  5/14    Family History  Problem Relation Age of Onset  . Diabetes Neg Hx   . Heart disease Neg Hx   . Hypertension Neg Hx   . Cancer Neg Hx     Social History   Social History  . Marital status: Single    Spouse name: N/A  . Number of children: 0  . Years of education: N/A   Occupational History  . Diabled    Social History Main Topics  . Smoking status: Never Smoker  . Smokeless tobacco: Never Used  . Alcohol use No  . Drug use: No  . Sexual activity: Not on file   Other Topics Concern  . Not on file   Social  History Narrative       Review of Systems  Still satisfied with suprapubic catheter Voids okay Pain control is fair with the tramadol     Objective:   Physical Exam  Constitutional:  Somnolent in bed but awakens to voice with normal interaction  Cardiovascular: Normal rate, regular rhythm and normal heart sounds.  Exam reveals no gallop.   No murmur heard. Pulmonary/Chest: Effort normal. No respiratory distress. He has no wheezes.  Abdominal: There is no rebound and no guarding.  Moderate distention in bed Mild tenderness today (this is nothing new)  Musculoskeletal: He exhibits no edema.  Neurological:  Same hypotonicity and lack of muscle strength  Skin:  25 x 49mm ulcer on left ischium Central open area with white eschar Surrounding area appears to be granulating tissue but not epithelialized   Psychiatric: He has a normal mood and affect. His behavior is normal.          Assessment & Plan:

## 2016-07-31 NOTE — Assessment & Plan Note (Signed)
Doesn't appear to be infected Discussed using gauze instead of telfa--with the bacitracin---to try to debride that little bit of whitish eschar

## 2016-07-31 NOTE — Assessment & Plan Note (Signed)
End stage Total care

## 2016-07-31 NOTE — Assessment & Plan Note (Signed)
Tachycardia is variable but due to myopathy No Rx available

## 2016-07-31 NOTE — Assessment & Plan Note (Signed)
Only able to eat ~1.5 meals a day due to bloating and pain Weight seems stable but probably only about 60#

## 2016-07-31 NOTE — Assessment & Plan Note (Signed)
Has used the tizanidine occasionally again as part of his regimen with the tramadol Will put back on the list

## 2016-08-29 ENCOUNTER — Telehealth: Payer: Self-pay

## 2016-08-29 NOTE — Telephone Encounter (Signed)
Dad said he would like to discuss Lynk's meds. He said he is hallucinating more. Michela Pitcher he is really hard to get up in the mornings. Michela Pitcher he is Jack Quarto and Mathews Robinsons until he is fully awake. Asked that Dr Silvio Pate call at his convenience to go over his meds.

## 2016-08-30 NOTE — Telephone Encounter (Signed)
Not sleeping well  Will call the nurse to "move my arm", then ask the same thing before Not really "there" till they can really awaken him No true hallucinations though Hasn't take the morphine. Not regular with tizandine  Discussed that the most likely inciting med would be the oxybutynin. Asked him to stop the oxybutynin and see if that resolves this problem. Will have to monitor whether abdominal symptoms worsen off it (and if they don't, I wouldn't recommend restarting no matter what)

## 2016-09-03 NOTE — Telephone Encounter (Signed)
pts father left v/m requesting what to try next; "pt is not sound,oxybutynin was stopped; but pt still very delirious. pts father does not know if problem with K or what to do. Mr Ralph wants to talk with Dr Silvio Pate again.

## 2016-09-04 NOTE — Telephone Encounter (Signed)
[name] (dad) called back would like a call from dr Elaina Hoops number [phone]

## 2016-09-04 NOTE — Telephone Encounter (Signed)
Discussed with dad No better off the oxybutynin---but no increase symptoms so will stay off it Still confused and repeating himself, etc  Will try spreading apart the tramadol (he takes 3 close together at night) and maybe weaning some Alprazolam may also be part of the problem  If no better in the next couple of days, I will plan a home visit next week

## 2016-09-06 ENCOUNTER — Telehealth: Payer: Self-pay

## 2016-09-06 NOTE — Telephone Encounter (Signed)
Pt's father Jenny Reichmann left a VM states "you don't have to see pt next week, he said he will try to work with pt's medications" he asked for a call back he said it's "complicated".

## 2016-09-07 NOTE — Telephone Encounter (Signed)
Okay I will make the other visit I had planned

## 2016-09-13 ENCOUNTER — Telehealth: Payer: Self-pay

## 2016-09-13 NOTE — Telephone Encounter (Signed)
Patient's father calls requesting some medication for nausea and states phenergan has worked well in the past.  Also has questions regarding Cymbalta.  Call returned by this writer to obtain more information at 5:30pm.  No answer so LM for father apologizing for being unable to reach him and stated that if patient is needing more urgent attention for medication to please call our main number and access our on call physician.    I will forward this communication to Dr. Silvio Pate as an Juluis Rainier and for CMA to follow up on Monday.

## 2016-09-14 MED ORDER — PROMETHAZINE HCL 12.5 MG PO TABS: 12.5000 mg | ORAL_TABLET | Freq: Four times a day (QID) | ORAL | 3 refills | Status: AC | PRN

## 2016-09-14 NOTE — Telephone Encounter (Signed)
PC with dad Has weaned off the tramadol and the confusion, etc got better. No increase in pain--in fact, abdomen might have been better Able to be out of bed for increased time More coherent and "back to himself"  Will stop the tramadol. Okay to try off the alprazolam if stable (or change to just prn)  Please call [facility] on Monday Tramadol has been discontinued

## 2016-09-17 ENCOUNTER — Telehealth: Payer: Self-pay | Admitting: Internal Medicine

## 2016-09-17 NOTE — Telephone Encounter (Signed)
Pts father called about tramadol withdrawal.  He also has a question about the cymbalta.  Please call him on his cell.

## 2016-09-17 NOTE — Telephone Encounter (Signed)
Spoke with Katharine Look with [facility] ( the only tele# I could find in pts chart) and advised per DrLetvak. She states she has not been out there recently, but will contact pts father and have him call office with update of pts condition

## 2016-09-18 NOTE — Telephone Encounter (Signed)
pts father left v/m; pt had a pretty good day after got pt up and awake; pts father would like to make more changes and request cb. I spoke with pts father and nighttime has been unmanageable; during the day pt is back to himself. pts father said pt has decided not to take evening xanax and see how pt feels tomorrow. Will continue the cymbalta. Only give one dose of morphine under the tongue at night; pt was experiencing a flushed feeling. pts father is going to try putting morphine in food or another liquid to see if still gets flushed feeling. Lastly pts dad wants pt to have some blood work done (like a routine panel of blood testing). [name] wants to know if [name] could draw blood. Pt request cb on 09-19-16.

## 2016-09-18 NOTE — Telephone Encounter (Signed)
I called him. He is off tramadol and the sweats are gone Still having tough and restless nights--though better (more awake) in day Wonders about cutting duloxetine to $RemoveBefor'20mg'[id number]$  (probably wouldn't make a difference) Discussed aleve at night Can try one dose of morphine to help him settle at night He will keep me updated

## 2016-09-19 NOTE — Telephone Encounter (Signed)
Let him know that I am okay with what he is planning. Please check with [facility]---see if they can draw labs--- CBC, met C, free T4

## 2016-09-19 NOTE — Telephone Encounter (Signed)
Left message to call back for Katharine Look, [place].

## 2016-09-19 NOTE — Telephone Encounter (Signed)
Spoke to pt's Dad. He said he just got a report that he is seems to be back to his old self and was doing really well.

## 2016-09-23 ENCOUNTER — Telehealth: Payer: Self-pay | Admitting: Internal Medicine

## 2016-09-23 NOTE — Telephone Encounter (Signed)
Dad calls with questions about stopping cymbalta. Doing okay with pain with tylenol/ibuprofen but still sleep/dreams/anxiety issues  Okay to try weaning cymbalta to every other day and if no problems, stop it after 1-2 weeks Once he is off much of the meds, we can experiment with other things to see if helps symptoms (and then stop if it doesn't---one thing at a time)\ He asks about cannabinoids. Discussed my concerns about the synthetics and legality, but it might actually be something that might help

## 2016-09-26 ENCOUNTER — Telehealth: Payer: Self-pay

## 2016-09-26 NOTE — Telephone Encounter (Signed)
Spoke to Dad. One of the nurses gave him some of his meds they had taken him off of.

## 2016-09-26 NOTE — Telephone Encounter (Signed)
I might be able to come out in 2 weeks (next Wednesday is a holiday) Call [facility] and see if they can draw CBC, met C, free T4 (diagnosis fatigue)

## 2016-09-26 NOTE — Telephone Encounter (Signed)
Spoke to Ecru at [place]. Gave orders for labs.

## 2016-09-26 NOTE — Telephone Encounter (Signed)
[name] pts father left v/m requesting cb; [name] thought [name] was going to be scheduled for bloodwork; pt still not doing well; last few days have been pretty harsh on pt. [name] also wants to know when Dr Silvio Pate plans his next visit; [name] thinks next visit might be sooner than later. Last home visit 07/31/16. Please advise.

## 2016-09-26 NOTE — Telephone Encounter (Signed)
See new note

## 2016-09-27 ENCOUNTER — Encounter: Payer: Self-pay | Admitting: Internal Medicine

## 2016-09-27 DIAGNOSIS — R5382 Chronic fatigue, unspecified: Secondary | ICD-10-CM | POA: Diagnosis not present

## 2016-10-04 ENCOUNTER — Telehealth: Payer: Self-pay | Admitting: Internal Medicine

## 2016-10-04 NOTE — Telephone Encounter (Signed)
PC from [employer] yesterday around 1:15PM. Called to home as patient has died I agreed to sign death certificate as it was a natural death. Spoke to father later to pass on my condolences.

## 2016-10-30 DEATH — deceased

## 2017-01-25 IMAGING — US US ABDOMEN COMPLETE
1 series · 14 of 25 positions shown · non-contrast
Comparison: None.

CLINICAL DATA: Generalized abdominal pain for 10 years.

EXAM:
ABDOMEN ULTRASOUND COMPLETE

[Series 1: us abdomen complete · 0.19mm/px · 14 of 121 slices shown]
[im 1/121]
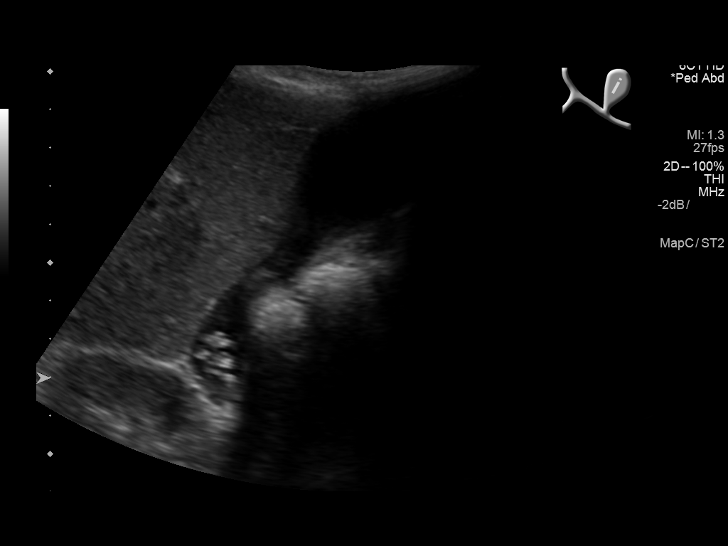
[im 11/121]
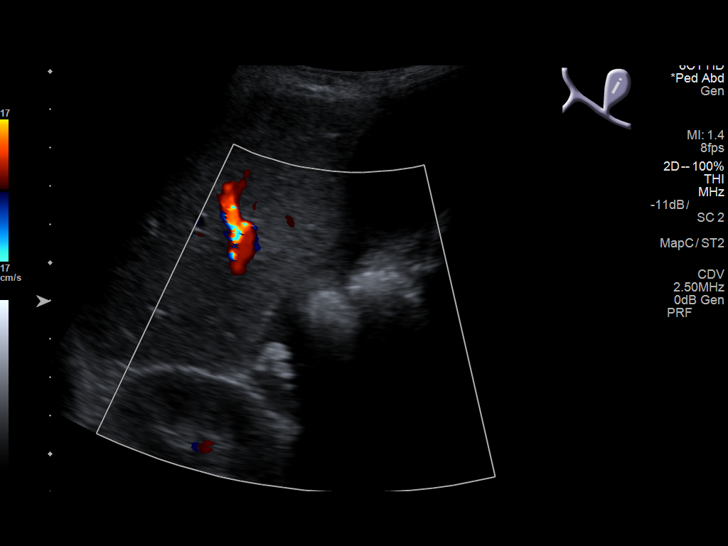
[im 21/121]
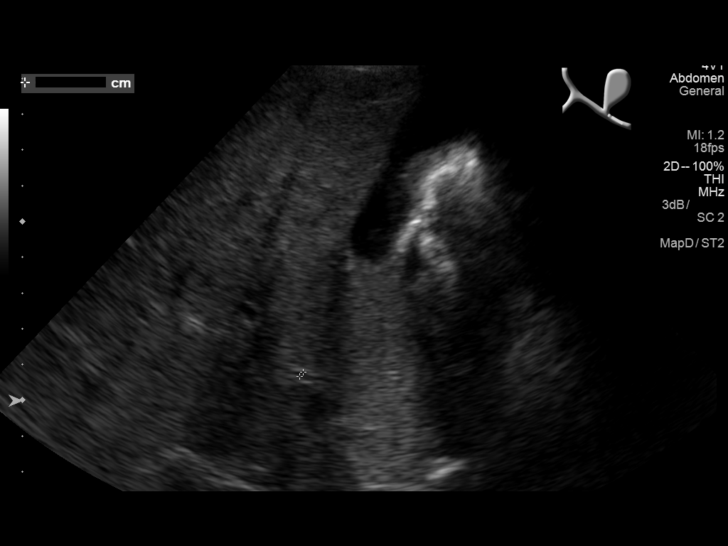
[im 31/121]
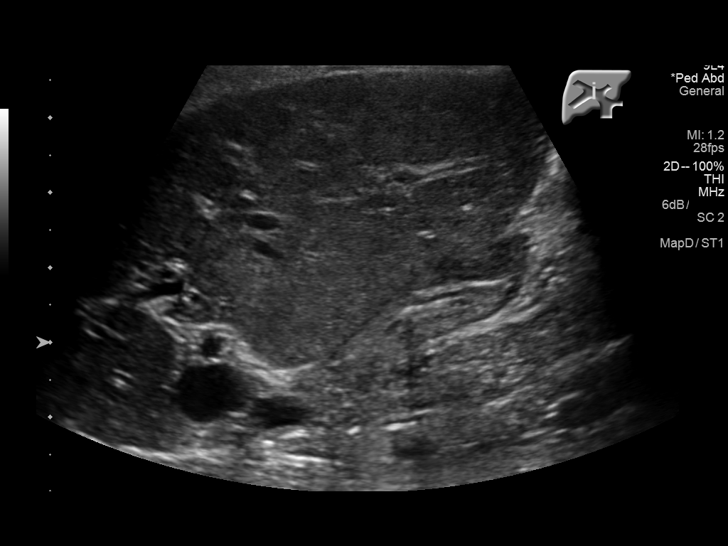
[im 41/121]
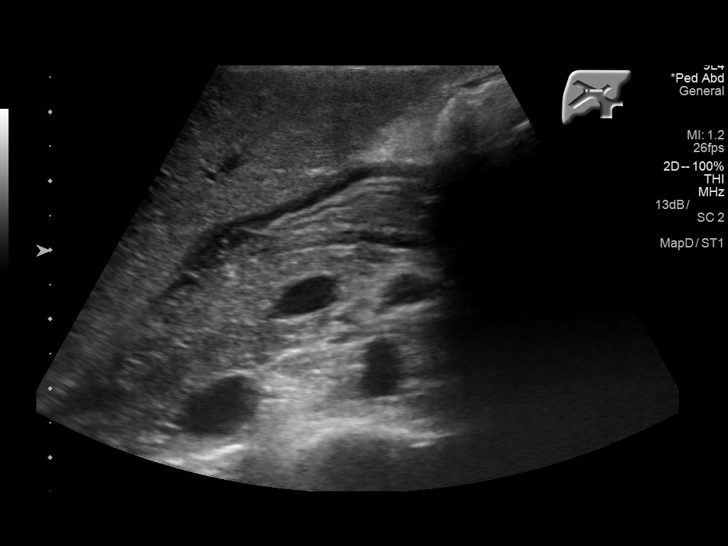
[im 46/121]
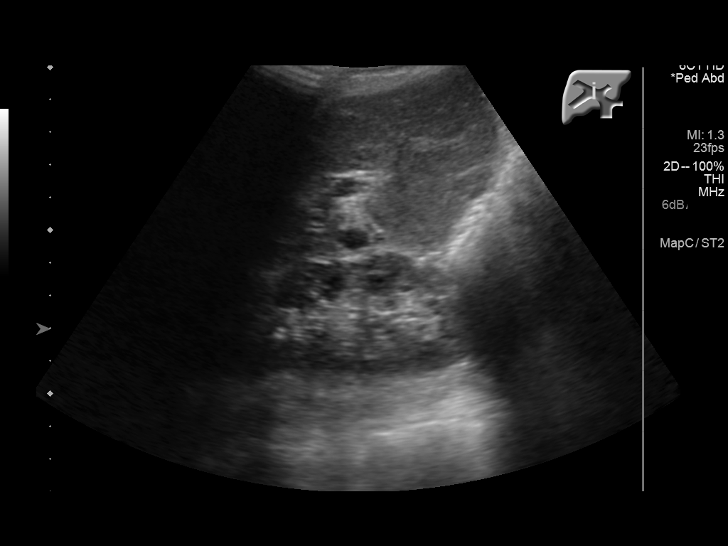
[im 56/121]
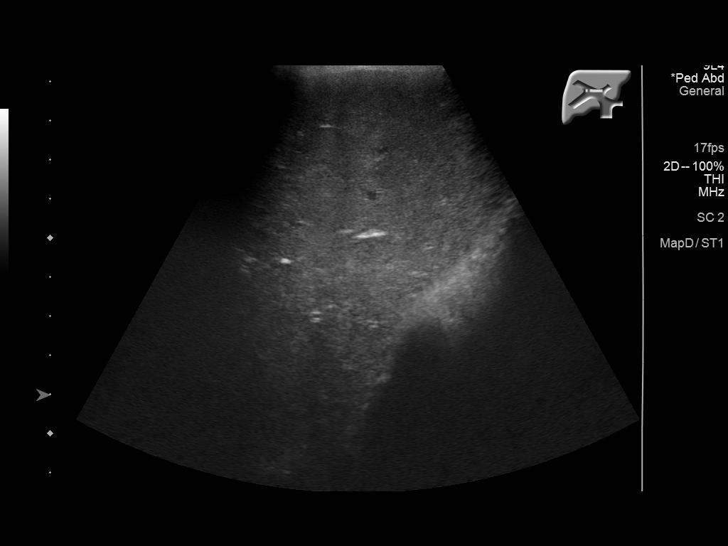
[im 66/121]
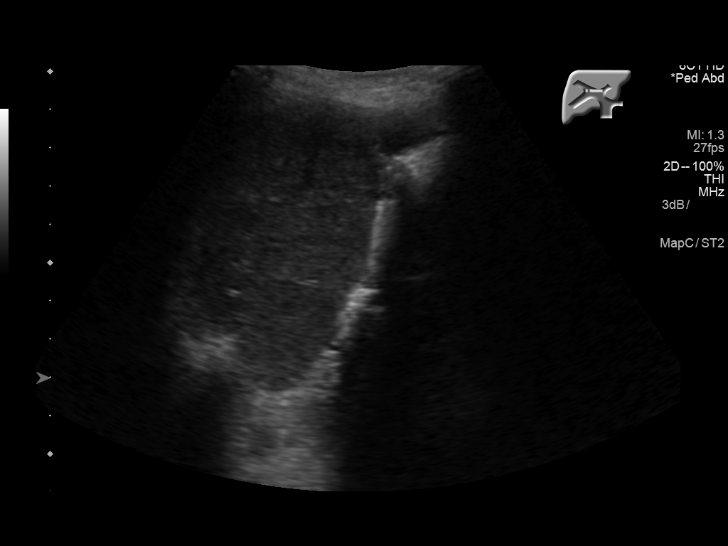
[im 76/121]
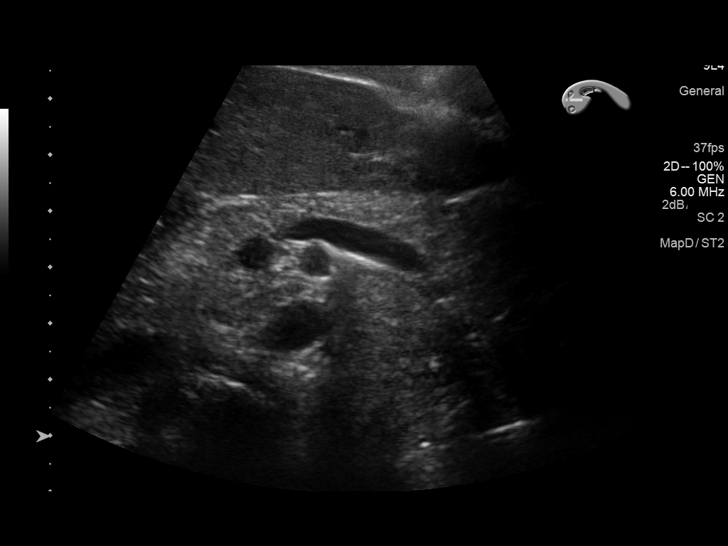
[im 81/121]
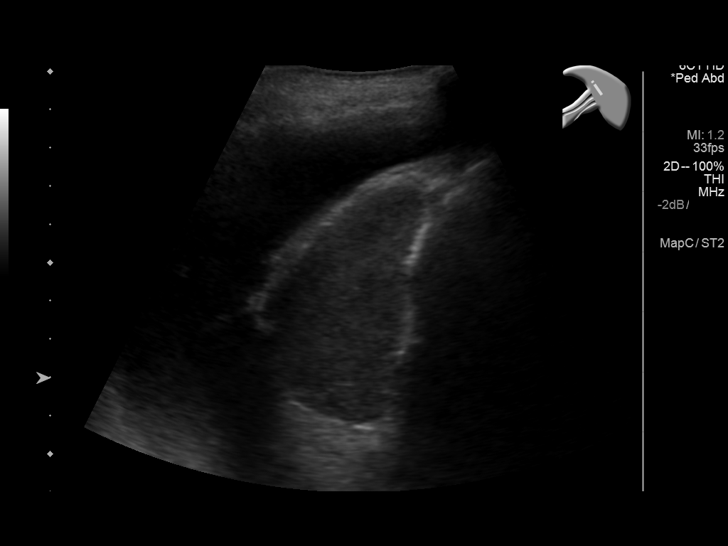
[im 91/121]
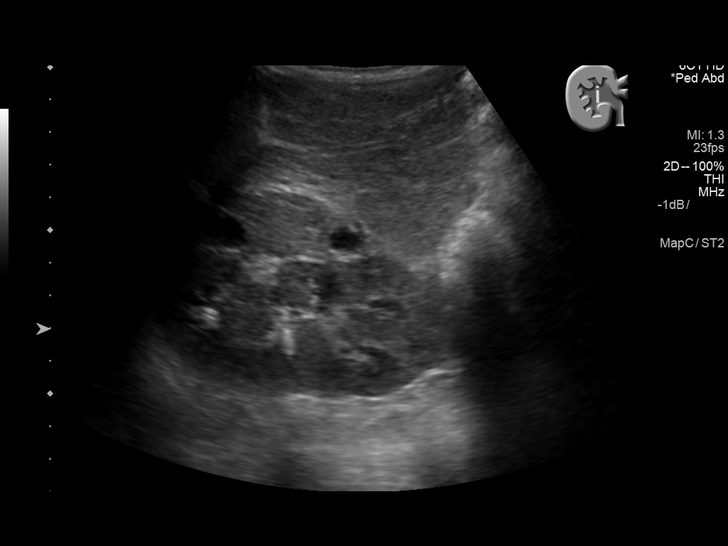
[im 101/121]
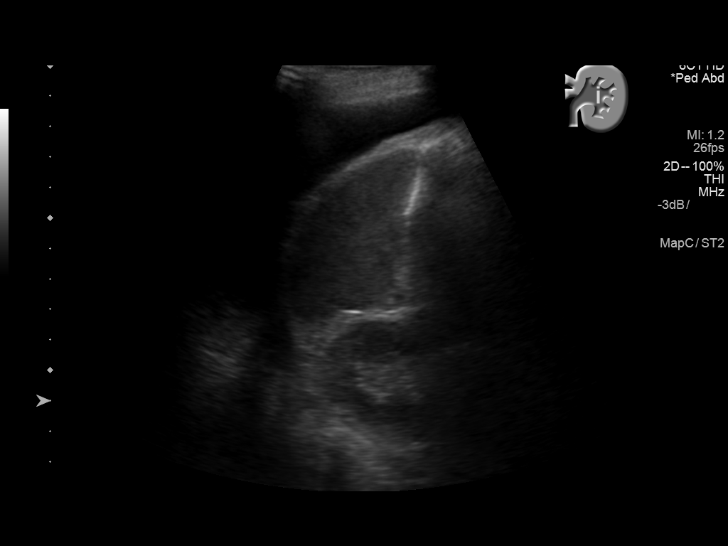
[im 111/121]
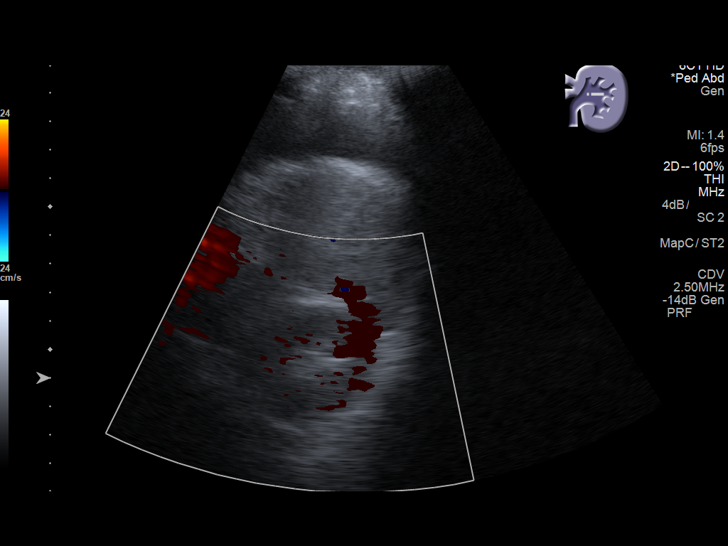
[im 121/121]
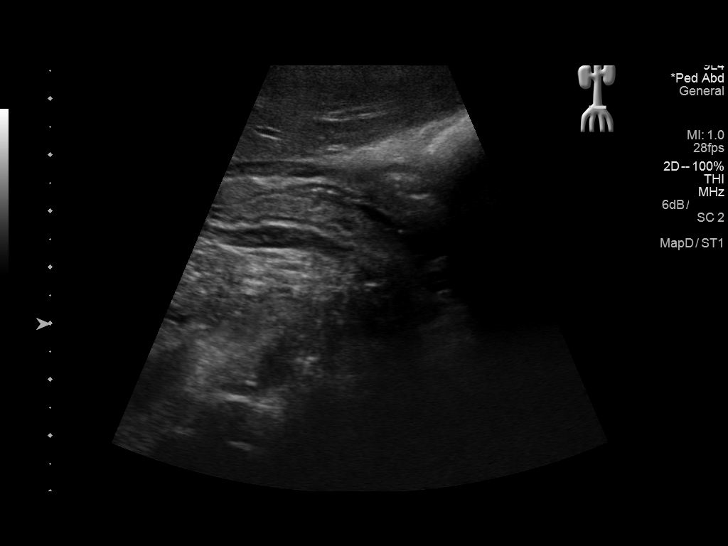

[14 of 25 positions shown; findings below may reference images not displayed]

FINDINGS: Gallbladder: Gallbladder index stones, measuring at 8 mm. No wall
thickening or pericholecystic fluid. Sonographic Murphy's sign was
not elicited.

Common bile duct: Diameter: Normal, 2 mm.

Liver: No focal lesion identified. Within normal limits in
parenchymal echogenicity.

IVC: No abnormality visualized.

Pancreas: Visualized portion unremarkable.

Spleen: Size and appearance within normal limits.

Right Kidney: Length: 9.8 cm. 6 mm upper pole right renal
nonobstructive calculus.. No hydronephrosis.

Left Kidney: Length: 8.0 cm.. This may be atrophic or the
measurement may be foreshortened secondary to obliquity. No
hydronephrosis.

Abdominal aorta: No aneurysm visualized.

Other findings: Left-sided pleural effusion incidentally noted.

Limitations secondary to patient clinical status.
IMPRESSION: 1. Cholelithiasis.
2. Right nephrolithiasis.
3. Decreased sensitivity and specificity exam due to technique
related factors, as described above.
4. Left pleural effusion.
